# Patient Record
Sex: Male | Born: 1970 | Race: Black or African American | Hispanic: No | Marital: Single | Smoking: Never smoker
Health system: Southern US, Community
[De-identification: ages and names within clinical notes are randomized; demographics above are authoritative.]

## PROBLEM LIST (undated history)

## (undated) DIAGNOSIS — M549 Dorsalgia, unspecified: Secondary | ICD-10-CM

## (undated) DIAGNOSIS — G8929 Other chronic pain: Secondary | ICD-10-CM

## (undated) DIAGNOSIS — M25562 Pain in left knee: Secondary | ICD-10-CM

## (undated) DIAGNOSIS — M25561 Pain in right knee: Secondary | ICD-10-CM

## (undated) HISTORY — DX: Pain in right knee: M25.561

## (undated) HISTORY — DX: Other chronic pain: G89.29

## (undated) HISTORY — DX: Pain in left knee: M25.562

## (undated) HISTORY — DX: Dorsalgia, unspecified: M54.9

## (undated) SURGERY — Surgical Case
Anesthesia: *Unknown

---

## 2011-04-26 ENCOUNTER — Emergency Department (HOSPITAL_COMMUNITY): Payer: Worker's Compensation

## 2011-04-26 ENCOUNTER — Emergency Department (HOSPITAL_COMMUNITY)
Admission: EM | Admit: 2011-04-26 | Discharge: 2011-04-26 | Disposition: A | Discharge status: Home / Self Care | Payer: Worker's Compensation | Attending: Emergency Medicine | Admitting: Emergency Medicine

## 2011-04-26 DIAGNOSIS — M5137 Other intervertebral disc degeneration, lumbosacral region: Secondary | ICD-10-CM | POA: Insufficient documentation

## 2011-04-26 DIAGNOSIS — M79609 Pain in unspecified limb: Secondary | ICD-10-CM | POA: Insufficient documentation

## 2011-04-26 DIAGNOSIS — M5380 Other specified dorsopathies, site unspecified: Secondary | ICD-10-CM | POA: Insufficient documentation

## 2011-04-26 DIAGNOSIS — IMO0002 Reserved for concepts with insufficient information to code with codable children: Secondary | ICD-10-CM | POA: Insufficient documentation

## 2011-04-26 DIAGNOSIS — M1710 Unilateral primary osteoarthritis, unspecified knee: Secondary | ICD-10-CM

## 2011-04-26 DIAGNOSIS — M545 Low back pain, unspecified: Secondary | ICD-10-CM | POA: Insufficient documentation

## 2011-04-26 DIAGNOSIS — M51379 Other intervertebral disc degeneration, lumbosacral region without mention of lumbar back pain or lower extremity pain: Secondary | ICD-10-CM | POA: Insufficient documentation

## 2011-04-26 DIAGNOSIS — M25569 Pain in unspecified knee: Secondary | ICD-10-CM | POA: Insufficient documentation

## 2011-04-26 DIAGNOSIS — M5431 Sciatica, right side: Secondary | ICD-10-CM

## 2011-04-26 DIAGNOSIS — G8929 Other chronic pain: Secondary | ICD-10-CM | POA: Insufficient documentation

## 2011-04-26 DIAGNOSIS — M25469 Effusion, unspecified knee: Secondary | ICD-10-CM | POA: Insufficient documentation

## 2011-04-26 DIAGNOSIS — M5430 Sciatica, unspecified side: Secondary | ICD-10-CM | POA: Insufficient documentation

## 2011-04-26 MED ORDER — DEXAMETHASONE SODIUM PHOSPHATE 10 MG/ML IJ SOLN
10.0000 mg | Freq: Once | INTRAMUSCULAR | Status: AC
  Administered 2011-04-26: 10 mg via INTRAMUSCULAR
  Filled 2011-04-26: qty 1

## 2011-04-26 MED ORDER — KETOROLAC TROMETHAMINE 60 MG/2ML IM SOLN
60.0000 mg | Freq: Once | INTRAMUSCULAR | Status: AC
  Administered 2011-04-26: 60 mg via INTRAMUSCULAR
  Filled 2011-04-26: qty 2

## 2011-04-26 MED ORDER — PREDNISONE (PAK) 10 MG PO TABS: ORAL_TABLET | ORAL | Status: AC

## 2011-04-26 MED ORDER — TRAMADOL HCL 50 MG PO TABS: 50.0000 mg | ORAL_TABLET | Freq: Four times a day (QID) | ORAL | Status: AC | PRN

## 2011-04-26 MED ORDER — METHOCARBAMOL 500 MG PO TABS: 500.0000 mg | ORAL_TABLET | Freq: Two times a day (BID) | ORAL | Status: AC

## 2011-04-26 MED ORDER — DICLOFENAC SODIUM 75 MG PO TBEC: 75.0000 mg | DELAYED_RELEASE_TABLET | Freq: Two times a day (BID) | ORAL | Status: AC

## 2011-04-26 NOTE — ED Provider Notes (Signed)
Medical screening examination/treatment/procedure(s) were performed by non-physician practitioner and as supervising physician I was immediately available for consultation/collaboration.  Catheline Hixon R. Gaelan Glennon, MD 04/26/11 1549 

## 2011-04-26 NOTE — ED Notes (Signed)
Patient transported to X-ray 

## 2011-04-26 NOTE — ED Notes (Signed)
Pt. Was injured in October at work and continues to have lower back pain and rt. Knee pain

## 2011-04-26 NOTE — ED Provider Notes (Signed)
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 Reports since October this year has began to have lower back pain and right knee pain. Reports pain is worse on the right side. Describes pain as beginning just right of his spine and radiates around posterior buttock down her right lateral thigh to right knee. States pain is worse with certain positions such as bending and standing. Reports has been unable to sleep for the last 3 nights due to severe pain. Reports occasionally his knee will hurt independently of his back. States he will have significant swelling and standing for long periods of time in his right knee. Reports occasionally feels as if his right knee will give out on him. Patient is a 40 y.o. male presenting with back pain and knee pain. The history is provided by the patient.  Back Pain  This is a chronic problem. Episode onset: 2 months ago. The problem occurs constantly. The problem has been gradually worsening. The pain is associated with lifting heavy objects. The pain is present in the lumbar spine. The quality of the pain is described as aching and shooting. The pain radiates to the right thigh and right knee. The pain is severe. The symptoms are aggravated by bending and certain positions. Associated symptoms include leg pain. Pertinent negatives include no chest pain, no fever, no numbness, no headaches, no abdominal pain, no bowel incontinence, no perianal numbness, no bladder incontinence, no dysuria, no pelvic pain, no paresthesias, no paresis, no tingling and no weakness. He has tried bed rest and NSAIDs for the symptoms. The treatment provided no relief.  Knee Pain This is a chronic problem. Episode onset: 2 months ago. The problem occurs constantly. The problem has  been gradually worsening. Associated symptoms include joint swelling. Pertinent negatives include no abdominal pain, chest pain, chills, coughing, fever, headaches, myalgias, nausea, neck pain, numbness, urinary symptoms, vomiting or weakness. The symptoms are aggravated by bending, walking and standing. He has tried rest and NSAIDs for the symptoms. The treatment provided no relief.    History reviewed. No pertinent past medical history.  History reviewed. No pertinent past surgical history.  History reviewed. No pertinent family history.  History  Substance Use Topics  . Smoking status: Never Smoker   . Smokeless tobacco: Never Used  . Alcohol Use: No      Review of Systems  Constitutional: Negative for fever and chills.  HENT: Negative for neck pain.   Respiratory: Negative for cough and shortness of breath.   Cardiovascular: Negative for chest pain and palpitations.  Gastrointestinal: Negative for nausea, vomiting, abdominal pain and bowel incontinence.  Genitourinary: Negative for bladder incontinence, dysuria, hematuria, flank pain and pelvic pain.  Musculoskeletal: Positive for back pain and joint swelling. Negative for myalgias and gait problem.       Positive for knee pain  Neurological: Negative for dizziness, tingling, weakness, numbness, headaches and paresthesias.  All other systems reviewed and are negative.    Allergies  Review of patient's allergies indicates no known allergies.  Home Medications   Current Outpatient Rx  Name Route Sig Dispense Refill  . IBUPROFEN 200 MG PO TABS Oral Take 800 mg by mouth daily as needed. For pain       BP 140/83  Pulse 100  Temp(Src) 97.1 F (36.2 C) (Oral)  SpO2 100%  Physical Exam  Constitutional: He is oriented to person, place, and time. He appears well-developed and well-nourished.  HENT:  Head: Normocephalic and atraumatic.  Eyes: Pupils are equal, round, and reactive to light.  Musculoskeletal:       Right  knee: He exhibits swelling and effusion. He exhibits normal range of motion, no deformity, no laceration, no erythema, normal alignment, no LCL laxity, normal patellar mobility, no bony tenderness and no MCL laxity. tenderness found. Medial joint line and lateral joint line tenderness noted. No patellar tendon tenderness noted.       Lumbar back: He exhibits tenderness, pain and spasm. He exhibits normal range of motion, no bony tenderness, no swelling, no edema, no deformity and no laceration.       Back:       Patient has a positive straight-leg raise on the right side. Normal strength, pulses, sensation distally. Reproduction of pain with percussion over right lumbar paraspinal region.   Neurological: He is alert and oriented to person, place, and time.  Skin: Skin is warm and dry. No rash noted. No erythema. No pallor.  Psychiatric: He has a normal mood and affect. His behavior is normal.    ED Course  Procedures   No results found for this or any previous visit. Dg Lumbar Spine Complete  04/26/2011  *RADIOLOGY REPORT*  Clinical Data: Lower back pain, injured lifting  LUMBAR SPINE - COMPLETE 4+ VIEW  Comparison: None  Findings: Hypoplastic last pair of ribs. Four non-rib bearing lumbar type vertebrae. Minimal scattered disc space narrowing and endplate spur formation. Vertebral body and disc space heights otherwise maintained. Osseous mineralization normal. No acute fracture, subluxation or bone destruction. No spondylolysis. SI joints symmetric.  IMPRESSION: Transitional anatomy. Scattered mild degenerative disc disease changes. No acute abnormalities.  Original Report Authenticated By: Lollie Marrow, M.D.   Dg Knee Complete 4 Views Right  04/26/2011  *RADIOLOGY REPORT*  Clinical Data: Anterior knee pain, injured lifting  RIGHT KNEE - COMPLETE 4+ VIEW  Comparison: None  Findings: Scattered joint space narrowing and marginal spur formation. Bones diffusely demineralized. Question minimal  anterior infrapatellar soft tissue swelling. No acute fracture, dislocation or bone destruction. Irregularity of articular surfaces of both femoral condyles. Minimal knee joint effusion.  IMPRESSION: Bony demineralization. Osteoarthritic changes right knee with minimal knee joint effusion. No definite acute bony abnormalities.  Original Report Authenticated By: Lollie Marrow, M.D.    MDM   10:22 AM X-rays indicate osteoarthritis and degenerative disease. Likely patient has a component of sciatica as well. Will treat with anti-inflammatory medication steroids and muscle relaxers. We'll also provide referrals for [facility] for persistent pain in further evaluation. Reports Toradol and Decadron has helped his pain.       Thomasene Lot, [place] 04/26/11 1038

## 2015-04-01 ENCOUNTER — Ambulatory Visit (INDEPENDENT_AMBULATORY_CARE_PROVIDER_SITE_OTHER): Payer: Managed Care, Other (non HMO) | Admitting: Emergency Medicine

## 2015-04-01 VITALS — BP 118/80 | HR 80 | Temp 98.4°F | Resp 16 | Ht 76.0 in | Wt 246.0 lb

## 2015-04-01 DIAGNOSIS — S335XXA Sprain of ligaments of lumbar spine, initial encounter: Secondary | ICD-10-CM | POA: Diagnosis not present

## 2015-04-01 MED ORDER — HYDROCODONE-ACETAMINOPHEN 5-325 MG PO TABS: 1.0000 | ORAL_TABLET | ORAL | Status: DC | PRN

## 2015-04-01 MED ORDER — CYCLOBENZAPRINE HCL 10 MG PO TABS: 10.0000 mg | ORAL_TABLET | Freq: Three times a day (TID) | ORAL | Status: DC | PRN

## 2015-04-01 MED ORDER — NAPROXEN SODIUM 550 MG PO TABS: 550.0000 mg | ORAL_TABLET | Freq: Two times a day (BID) | ORAL | Status: DC

## 2015-04-01 NOTE — Patient Instructions (Signed)

## 2015-04-01 NOTE — Progress Notes (Signed)
Subjective:  Patient ID: Mathew Burns, male    DOB: 02-16-1971  Age: 44 y.o. MRN: [id number]  CC: Back Pain   HPI [name] presents  with acute back pain. 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 No radicular symptoms no numbness tingling or weakness or radiation of pain. He's had no direct injury. He says the pain is worse when he bends over or sits. He has no history of prior back injury.  History Hazem has no past medical history on file.   He has no past surgical history on file.   His  family history is not on file.  He   reports that he has never smoked. He has never used smokeless tobacco. He reports that he does not drink alcohol. His drug history is not on file.  Outpatient Prescriptions Prior to Visit  Medication Sig Dispense Refill  . ibuprofen (ADVIL,MOTRIN) 200 MG tablet Take 800 mg by mouth daily as needed. For pain      No facility-administered medications prior to visit.    Social History   Social History  . Marital Status: Single    Spouse Name: N/A  . Number of Children: N/A  . Years of Education: N/A   Social History Main Topics  . Smoking status: Never Smoker   . Smokeless tobacco: Never Used  . Alcohol Use: No  . Drug Use: None  . Sexual Activity: Not Asked   Other Topics Concern  . None   Social History Narrative     Review of Systems  Constitutional: Negative for fever, chills and appetite change.  HENT: Negative for congestion, ear pain, postnasal drip, sinus pressure and sore throat.   Eyes: Negative for pain and redness.  Respiratory: Negative for cough, shortness of breath and wheezing.   Cardiovascular: Negative for leg swelling.  Gastrointestinal: Negative for nausea, vomiting, abdominal pain, diarrhea, constipation and blood in stool.  Endocrine: Negative for polyuria.  Genitourinary: Negative for dysuria, urgency, frequency and flank pain.  Musculoskeletal:  Positive for back pain. Negative for gait problem.  Skin: Negative for rash.  Neurological: Negative for weakness and headaches.  Psychiatric/Behavioral: Negative for confusion and decreased concentration. The patient is not nervous/anxious.     Objective:  BP 118/80 mmHg  Pulse 80  Temp(Src) 98.4 F (36.9 C) (Oral)  Resp 16  Ht  (1.93 m)  Wt 246 lb (111.585 kg)  BMI 29.96 kg/m2  SpO2 96%  Physical Exam  Constitutional: He is oriented to person, place, and time. He appears well-developed and well-nourished. No distress.  HENT:  Head: Normocephalic and atraumatic.  Right Ear: External ear normal.  Left Ear: External ear normal.  Nose: Nose normal.  Eyes: Conjunctivae and EOM are normal. Pupils are equal, round, and reactive to light. No scleral icterus.  Neck: Normal range of motion. Neck supple. No tracheal deviation present.  Cardiovascular: Normal rate, regular rhythm and normal heart sounds.   Pulmonary/Chest: Effort normal. No respiratory distress. He has no wheezes. He has no rales.  Abdominal: He exhibits no mass. There is no tenderness. There is no rebound and no guarding.  Musculoskeletal: He exhibits no edema.       Lumbar back: He exhibits tenderness and spasm.  Lymphadenopathy:    He has no cervical adenopathy.  Neurological: He is alert and oriented to person, place, and time. Coordination normal.  Skin: Skin is warm and dry. No  rash noted.  Psychiatric: He has a normal mood and affect. His behavior is normal.      Assessment & Plan:   Gregary SignsSean was seen today for back pain.  Diagnoses and all orders for this visit:  Sprain of lumbar region, initial encounter  Other orders -     naproxen sodium (ANAPROX DS) 550 MG tablet; Take 1 tablet (550 mg total) by mouth 2 (two) times daily with a meal. -     cyclobenzaprine (FLEXERIL) 10 MG tablet; Take 1 tablet (10 mg total) by mouth 3 (three) times daily as needed for muscle spasms. -      HYDROcodone-acetaminophen (NORCO) 5-325 MG tablet; Take 1-2 tablets by mouth every 4 (four) hours as needed.  I am having Mr. Conception OmsBlakemore start on naproxen sodium, cyclobenzaprine, and HYDROcodone-acetaminophen. I am also having him maintain his ibuprofen.  Meds ordered this encounter  Medications  . naproxen sodium (ANAPROX DS) 550 MG tablet    Sig: Take 1 tablet (550 mg total) by mouth 2 (two) times daily with a meal.    Dispense:  40 tablet    Refill:  0  . cyclobenzaprine (FLEXERIL) 10 MG tablet    Sig: Take 1 tablet (10 mg total) by mouth 3 (three) times daily as needed for muscle spasms.    Dispense:  30 tablet    Refill:  0  . HYDROcodone-acetaminophen (NORCO) 5-325 MG tablet    Sig: Take 1-2 tablets by mouth every 4 (four) hours as needed.    Dispense:  30 tablet    Refill:  0    Appropriate red flag conditions were discussed with the patient as well as actions that should be taken.  Patient expressed his understanding.  Follow-up: Return if symptoms worsen or fail to improve.  Carmelina DaneAnderson, Jeffery S, MD

## 2015-04-14 ENCOUNTER — Ambulatory Visit (INDEPENDENT_AMBULATORY_CARE_PROVIDER_SITE_OTHER): Payer: Managed Care, Other (non HMO)

## 2015-04-14 ENCOUNTER — Ambulatory Visit (INDEPENDENT_AMBULATORY_CARE_PROVIDER_SITE_OTHER): Payer: Managed Care, Other (non HMO) | Admitting: Family Medicine

## 2015-04-14 VITALS — BP 112/64 | HR 96 | Temp 98.3°F | Resp 18 | Ht 75.0 in | Wt 244.0 lb

## 2015-04-14 DIAGNOSIS — M545 Low back pain, unspecified: Secondary | ICD-10-CM

## 2015-04-14 MED ORDER — NAPROXEN SODIUM 550 MG PO TABS: 550.0000 mg | ORAL_TABLET | Freq: Two times a day (BID) | ORAL | Status: DC

## 2015-04-14 MED ORDER — HYDROCODONE-ACETAMINOPHEN 5-325 MG PO TABS: 1.0000 | ORAL_TABLET | Freq: Four times a day (QID) | ORAL | Status: DC | PRN

## 2015-04-14 MED ORDER — METHOCARBAMOL 500 MG PO TABS: 500.0000 mg | ORAL_TABLET | Freq: Four times a day (QID) | ORAL | Status: DC

## 2015-04-14 NOTE — Patient Instructions (Signed)
Low Back Sprain With Rehab A sprain is an injury in which a ligament is torn. The ligaments of the lower back are vulnerable to sprains. However, they are strong and require great force to be injured. These ligaments are important for stabilizing the spinal column. Sprains are classified into three categories. Grade 1 sprains cause pain, but the tendon is not lengthened. Grade 2 sprains include a lengthened ligament, due to the ligament being stretched or partially ruptured. With grade 2 sprains there is still function, although the function may be decreased. Grade 3 sprains involve a complete tear of the tendon or muscle, and function is usually impaired. SYMPTOMS   Severe pain in the lower back.  Sometimes, a feeling of a "pop," "snap," or tear, at the time of injury.  Tenderness and sometimes swelling at the injury site.  Uncommonly, bruising (contusion) within 48 hours of injury.  Muscle spasms in the back. CAUSES  Low back sprains occur when a force is placed on the ligaments that is greater than they can handle. Common causes of injury include:  Performing a stressful act while off-balance.  Repetitive stressful activities that involve movement of the lower back.  Direct hit (trauma) to the lower back. RISK INCREASES WITH:  Contact sports (football, wrestling).  Collisions (major skiing accidents).  Sports that require throwing or lifting (baseball, weightlifting).  Sports involving twisting of the spine (gymnastics, diving, tennis, golf).  Poor strength and flexibility.  Inadequate protection.  Previous back injury or surgery (especially fusion). PREVENTION  Wear properly fitted and padded protective equipment.  Warm up and stretch properly before activity.  Allow for adequate recovery between workouts.  Maintain physical fitness:  Strength, flexibility, and endurance.  Cardiovascular fitness.  Maintain a healthy body weight. PROGNOSIS  If treated properly,  low back sprains usually heal with non-surgical treatment. The length of time for healing depends on the severity of the injury.  RELATED COMPLICATIONS   Recurring symptoms, resulting in a chronic problem.  Chronic inflammation and pain in the low back.  Delayed healing or resolution of symptoms, especially if activity is resumed too soon.  Prolonged impairment.  Unstable or arthritic joints of the low back. TREATMENT  Treatment first involves the use of ice and medicine, to reduce pain and inflammation. The use of strengthening and stretching exercises may help reduce pain with activity. These exercises may be performed at home or with a therapist. Severe injuries may require referral to a therapist for further evaluation and treatment, such as ultrasound. Your caregiver may advise that you wear a back brace or corset, to help reduce pain and discomfort. Often, prolonged bed rest results in greater harm then benefit. Corticosteroid injections may be recommended. However, these should be reserved for the most serious cases. It is important to avoid using your back when lifting objects. At night, sleep on your back on a firm mattress, with a pillow placed under your knees. If non-surgical treatment is unsuccessful, surgery may be needed.  MEDICATION   If pain medicine is needed, nonsteroidal anti-inflammatory medicines (aspirin and ibuprofen), or other minor pain relievers (acetaminophen), are often advised.  Do not take pain medicine for 7 days before surgery.  Prescription pain relievers may be given, if your caregiver thinks they are needed. Use only as directed and only as much as you need.  Ointments applied to the skin may be helpful.  Corticosteroid injections may be given by your caregiver. These injections should be reserved for the most serious cases, because   they may only be given a certain number of times. HEAT AND COLD  Cold treatment (icing) should be applied for 10 to 15  minutes every 2 to 3 hours for inflammation and pain, and immediately after activity that aggravates your symptoms. Use ice packs or an ice massage.  Heat treatment may be used before performing stretching and strengthening activities prescribed by your caregiver, physical therapist, or athletic trainer. Use a heat pack or a warm water soak. SEEK MEDICAL CARE IF:   Symptoms get worse or do not improve in 2 to 4 weeks, despite treatment.  You develop numbness or weakness in either leg.  You lose bowel or bladder function.  Any of the following occur after surgery: fever, increased pain, swelling, redness, drainage of fluids, or bleeding in the affected area.  New, unexplained symptoms develop. (Drugs used in treatment may produce side effects.) EXERCISES  RANGE OF MOTION (ROM) AND STRETCHING EXERCISES - Low Back Sprain Most people with lower back pain will find that their symptoms get worse with excessive bending forward (flexion) or arching at the lower back (extension). The exercises that will help resolve your symptoms will focus on the opposite motion.  Your physician, physical therapist or athletic trainer will help you determine which exercises will be most helpful to resolve your lower back pain. Do not complete any exercises without first consulting with your caregiver. Discontinue any exercises which make your symptoms worse, until you speak to your caregiver. If you have pain, numbness or tingling which travels down into your buttocks, leg or foot, the goal of the therapy is for these symptoms to move closer to your back and eventually resolve. Sometimes, these leg symptoms will get better, but your lower back pain may worsen. This is often an indication of progress in your rehabilitation. Be very alert to any changes in your symptoms and the activities in which you participated in the 24 hours prior to the change. Sharing this information with your caregiver will allow him or her to most  efficiently treat your condition. These exercises may help you when beginning to rehabilitate your injury. Your symptoms may resolve with or without further involvement from your physician, physical therapist or athletic trainer. While completing these exercises, remember:   Restoring tissue flexibility helps normal motion to return to the joints. This allows healthier, less painful movement and activity.  An effective stretch should be held for at least 30 seconds.  A stretch should never be painful. You should only feel a gentle lengthening or release in the stretched tissue. FLEXION RANGE OF MOTION AND STRETCHING EXERCISES: STRETCH - Flexion, Single Knee to Chest   Lie on a firm bed or floor with both legs extended in front of you.  Keeping one leg in contact with the floor, bring your opposite knee to your chest. Hold your leg in place by either grabbing behind your thigh or at your knee.  Pull until you feel a gentle stretch in your low back. Hold __________ seconds.  Slowly release your grasp and repeat the exercise with the opposite side. Repeat __________ times. Complete this exercise __________ times per day.  STRETCH - Flexion, Double Knee to Chest  Lie on a firm bed or floor with both legs extended in front of you.  Keeping one leg in contact with the floor, bring your opposite knee to your chest.  Tense your stomach muscles to support your back and then lift your other knee to your chest. Hold your legs in   place by either grabbing behind your thighs or at your knees.  Pull both knees toward your chest until you feel a gentle stretch in your low back. Hold __________ seconds.  Tense your stomach muscles and slowly return one leg at a time to the floor. Repeat __________ times. Complete this exercise __________ times per day.  STRETCH - Low Trunk Rotation  Lie on a firm bed or floor. Keeping your legs in front of you, bend your knees so they are both pointed toward the  ceiling and your feet are flat on the floor.  Extend your arms out to the side. This will stabilize your upper body by keeping your shoulders in contact with the floor.  Gently and slowly drop both knees together to one side until you feel a gentle stretch in your low back. Hold for __________ seconds.  Tense your stomach muscles to support your lower back as you bring your knees back to the starting position. Repeat the exercise to the other side. Repeat __________ times. Complete this exercise __________ times per day  EXTENSION RANGE OF MOTION AND FLEXIBILITY EXERCISES: STRETCH - Extension, Prone on Elbows   Lie on your stomach on the floor, a bed will be too soft. Place your palms about shoulder width apart and at the height of your head.  Place your elbows under your shoulders. If this is too painful, stack pillows under your chest.  Allow your body to relax so that your hips drop lower and make contact more completely with the floor.  Hold this position for __________ seconds.  Slowly return to lying flat on the floor. Repeat __________ times. Complete this exercise __________ times per day.  RANGE OF MOTION - Extension, Prone Press Ups  Lie on your stomach on the floor, a bed will be too soft. Place your palms about shoulder width apart and at the height of your head.  Keeping your back as relaxed as possible, slowly straighten your elbows while keeping your hips on the floor. You may adjust the placement of your hands to maximize your comfort. As you gain motion, your hands will come more underneath your shoulders.  Hold this position __________ seconds.  Slowly return to lying flat on the floor. Repeat __________ times. Complete this exercise __________ times per day.  RANGE OF MOTION- Quadruped, Neutral Spine   Assume a hands and knees position on a firm surface. Keep your hands under your shoulders and your knees under your hips. You may place padding under your knees for  comfort.  Drop your head and point your tailbone toward the ground below you. This will round out your lower back like an angry cat. Hold this position for __________ seconds.  Slowly lift your head and release your tail bone so that your back sags into a large arch, like an old horse.  Hold this position for __________ seconds.  Repeat this until you feel limber in your low back.  Now, find your "sweet spot." This will be the most comfortable position somewhere between the two previous positions. This is your neutral spine. Once you have found this position, tense your stomach muscles to support your low back.  Hold this position for __________ seconds. Repeat __________ times. Complete this exercise __________ times per day.  STRENGTHENING EXERCISES - Low Back Sprain These exercises may help you when beginning to rehabilitate your injury. These exercises should be done near your "sweet spot." This is the neutral, low-back arch, somewhere between fully rounded and   fully arched, that is your least painful position. When performed in this safe range of motion, these exercises can be used for people who have either a flexion or extension based injury. These exercises may resolve your symptoms with or without further involvement from your physician, physical therapist or athletic trainer. While completing these exercises, remember:   Muscles can gain both the endurance and the strength needed for everyday activities through controlled exercises.  Complete these exercises as instructed by your physician, physical therapist or athletic trainer. Increase the resistance and repetitions only as guided.  You may experience muscle soreness or fatigue, but the pain or discomfort you are trying to eliminate should never worsen during these exercises. If this pain does worsen, stop and make certain you are following the directions exactly. If the pain is still present after adjustments, discontinue the  exercise until you can discuss the trouble with your caregiver. STRENGTHENING - Deep Abdominals, Pelvic Tilt   Lie on a firm bed or floor. Keeping your legs in front of you, bend your knees so they are both pointed toward the ceiling and your feet are flat on the floor.  Tense your lower abdominal muscles to press your low back into the floor. This motion will rotate your pelvis so that your tail bone is scooping upwards rather than pointing at your feet or into the floor. With a gentle tension and even breathing, hold this position for __________ seconds. Repeat __________ times. Complete this exercise __________ times per day.  STRENGTHENING - Abdominals, Crunches   Lie on a firm bed or floor. Keeping your legs in front of you, bend your knees so they are both pointed toward the ceiling and your feet are flat on the floor. Cross your arms over your chest.  Slightly tip your chin down without bending your neck.  Tense your abdominals and slowly lift your trunk high enough to just clear your shoulder blades. Lifting higher can put excessive stress on the lower back and does not further strengthen your abdominal muscles.  Control your return to the starting position. Repeat __________ times. Complete this exercise __________ times per day.  STRENGTHENING - Quadruped, Opposite UE/LE Lift   Assume a hands and knees position on a firm surface. Keep your hands under your shoulders and your knees under your hips. You may place padding under your knees for comfort.  Find your neutral spine and gently tense your abdominal muscles so that you can maintain this position. Your shoulders and hips should form a rectangle that is parallel with the floor and is not twisted.  Keeping your trunk steady, lift your right hand no higher than your shoulder and then your left leg no higher than your hip. Make sure you are not holding your breath. Hold this position for __________ seconds.  Continuing to keep  your abdominal muscles tense and your back steady, slowly return to your starting position. Repeat with the opposite arm and leg. Repeat __________ times. Complete this exercise __________ times per day.  STRENGTHENING - Abdominals and Quadriceps, Straight Leg Raise   Lie on a firm bed or floor with both legs extended in front of you.  Keeping one leg in contact with the floor, bend the other knee so that your foot can rest flat on the floor.  Find your neutral spine, and tense your abdominal muscles to maintain your spinal position throughout the exercise.  Slowly lift your straight leg off the floor about 6 inches for a count of   15, making sure to not hold your breath.  Still keeping your neutral spine, slowly lower your leg all the way to the floor. Repeat this exercise with each leg __________ times. Complete this exercise __________ times per day. POSTURE AND BODY MECHANICS CONSIDERATIONS - Low Back Sprain Keeping correct posture when sitting, standing or completing your activities will reduce the stress put on different body tissues, allowing injured tissues a chance to heal and limiting painful experiences. The following are general guidelines for improved posture. Your physician or physical therapist will provide you with any instructions specific to your needs. While reading these guidelines, remember:  The exercises prescribed by your provider will help you have the flexibility and strength to maintain correct postures.  The correct posture provides the best environment for your joints to work. All of your joints have less wear and tear when properly supported by a spine with good posture. This means you will experience a healthier, less painful body.  Correct posture must be practiced with all of your activities, especially prolonged sitting and standing. Correct posture is as important when doing repetitive low-stress activities (typing) as it is when doing a single heavy-load  activity (lifting). RESTING POSITIONS Consider which positions are most painful for you when choosing a resting position. If you have pain with flexion-based activities (sitting, bending, stooping, squatting), choose a position that allows you to rest in a less flexed posture. You would want to avoid curling into a fetal position on your side. If your pain worsens with extension-based activities (prolonged standing, working overhead), avoid resting in an extended position such as sleeping on your stomach. Most people will find more comfort when they rest with their spine in a more neutral position, neither too rounded nor too arched. Lying on a non-sagging bed on your side with a pillow between your knees, or on your back with a pillow under your knees will often provide some relief. Keep in mind, being in any one position for a prolonged period of time, no matter how correct your posture, can still lead to stiffness. PROPER SITTING POSTURE In order to minimize stress and discomfort on your spine, you must sit with correct posture. Sitting with good posture should be effortless for a healthy body. Returning to good posture is a gradual process. Many people can work toward this most comfortably by using various supports until they have the flexibility and strength to maintain this posture on their own. When sitting with proper posture, your ears will fall over your shoulders and your shoulders will fall over your hips. You should use the back of the chair to support your upper back. Your lower back will be in a neutral position, just slightly arched. You may place a small pillow or folded towel at the base of your lower back for  support.  When working at a desk, create an environment that supports good, upright posture. Without extra support, muscles tire, which leads to excessive strain on joints and other tissues. Keep these recommendations in mind: CHAIR:  A chair should be able to slide under your desk  when your back makes contact with the back of the chair. This allows you to work closely.  The chair's height should allow your eyes to be level with the upper part of your monitor and your hands to be slightly lower than your elbows. BODY POSITION  Your feet should make contact with the floor. If this is not possible, use a foot rest.  Keep your ears   over your shoulders. This will reduce stress on your neck and low back. INCORRECT SITTING POSTURES  If you are feeling tired and unable to assume a healthy sitting posture, do not slouch or slump. This puts excessive strain on your back tissues, causing more damage and pain. Healthier options include:  Using more support, like a lumbar pillow.  Switching tasks to something that requires you to be upright or walking.  Talking a brief walk.  Lying down to rest in a neutral-spine position. PROLONGED STANDING WHILE SLIGHTLY LEANING FORWARD  When completing a task that requires you to lean forward while standing in one place for a long time, place either foot up on a stationary 2-4 inch high object to help maintain the best posture. When both feet are on the ground, the lower back tends to lose its slight inward curve. If this curve flattens (or becomes too large), then the back and your other joints will experience too much stress, tire more quickly, and can cause pain. CORRECT STANDING POSTURES Proper standing posture should be assumed with all daily activities, even if they only take a few moments, like when brushing your teeth. As in sitting, your ears should fall over your shoulders and your shoulders should fall over your hips. You should keep a slight tension in your abdominal muscles to brace your spine. Your tailbone should point down to the ground, not behind your body, resulting in an over-extended swayback posture.  INCORRECT STANDING POSTURES  Common incorrect standing postures include a forward head, locked knees and/or an excessive  swayback. WALKING Walk with an upright posture. Your ears, shoulders and hips should all line-up. PROLONGED ACTIVITY IN A FLEXED POSITION When completing a task that requires you to bend forward at your waist or lean over a low surface, try to find a way to stabilize 3 out of 4 of your limbs. You can place a hand or elbow on your thigh or rest a knee on the surface you are reaching across. This will provide you more stability, so that your muscles do not tire as quickly. By keeping your knees relaxed, or slightly bent, you will also reduce stress across your lower back. CORRECT LIFTING TECHNIQUES DO :  Assume a wide stance. This will provide you more stability and the opportunity to get as close as possible to the object which you are lifting.  Tense your abdominals to brace your spine. Bend at the knees and hips. Keeping your back locked in a neutral-spine position, lift using your leg muscles. Lift with your legs, keeping your back straight.  Test the weight of unknown objects before attempting to lift them.  Try to keep your elbows locked down at your sides in order get the best strength from your shoulders when carrying an object.  Always ask for help when lifting heavy or awkward objects. INCORRECT LIFTING TECHNIQUES DO NOT:   Lock your knees when lifting, even if it is a small object.  Bend and twist. Pivot at your feet or move your feet when needing to change directions.  Assume that you can safely pick up even a paperclip without proper posture.   This information is not intended to replace advice given to you by your health care provider. Make sure you discuss any questions you have with your health care provider.   Document Released: 04/30/2005 Document Revised: 05/21/2014 Document Reviewed: 08/12/2008 [employer] Patient Education 2016 [employer]  

## 2015-04-14 NOTE — Progress Notes (Signed)
Subjective:    Patient ID: Mathew Burns, male    DOB: Sep 26, 1970, 44 y.o.   MRN: [id number]  04/14/2015  Follow-up   HPI This 44 y.o. male presents for two week follow-up of lower back pain/strain.  Evaluated by Dr. Dareen Piano on 04/01/15; prescribed Flexeril , hydrocodone 5/325, and Naproxen .  No imaging warranted at that visit.  Rested after last visit with improvement.  Returned to work after the weekend with worsening and recurrent pain.  Now having a constant pressure pain in lower back.  Scared of pain.  Had an episode where went down due to pain yesterday.  Lower back on R.  More constant than previously. Intermittent radiation when falls down only.  No n/t/w.  No saddle paresthesias.  No b/b dysfunction. Bending over a lot and feels like an old man.  No urinary symptoms.  Taking hydrocodone with food; takes Naproxen at lunch; takes Naproxen and hydrocodone at supper; then takes Flexeril at bedtime.  Pain worsens at nighttime.  Only taking 1/2 Flexeril because really strong.  Feels refreshed in morning which is an improvemnet; cannot take Flexeril during the day.  Heating pad which helps in evenings.  Elevating leg with improvement.  Tries to relax more; a very busy person and trying to relax.   Review of Systems  Constitutional: Negative for fever, chills, diaphoresis and fatigue.  Gastrointestinal: Negative for nausea, vomiting, abdominal pain, diarrhea, constipation, blood in stool, abdominal distention, anal bleeding and rectal pain.  Musculoskeletal: Positive for myalgias and back pain. Negative for joint swelling.  Skin: Negative for rash.  Neurological: Negative for weakness and numbness.    History reviewed. No pertinent past medical history. History reviewed. No pertinent past surgical history. No Known Allergies  Social History   Social History  . Marital Status: Single    Spouse Name: N/A  . Number of Children: N/A  . Years of Education: N/A   Occupational  History  . Not on file.   Social History Main Topics  . Smoking status: Never Smoker   . Smokeless tobacco: Never Used  . Alcohol Use: No  . Drug Use: Not on file  . Sexual Activity: Not on file   Other Topics Concern  . Not on file   Social History Narrative   History reviewed. No pertinent family history.     Objective:    BP 112/64 mmHg  Pulse 96  Temp(Src) 98.3 F (36.8 C)  Resp 18  Ht  (1.905 m)  Wt 244 lb (110.678 kg)  BMI 30.50 kg/m2  SpO2 98% Physical Exam  Constitutional: He is oriented to person, place, and time. He appears well-developed and well-nourished. No distress.  HENT:  Head: Normocephalic and atraumatic.  Eyes: Conjunctivae and EOM are normal. Pupils are equal, round, and reactive to light.  Neck: Normal range of motion. Neck supple. Carotid bruit is not present. No thyromegaly present.  Cardiovascular: Normal rate, regular rhythm, normal heart sounds and intact distal pulses.  Exam reveals no gallop and no friction rub.   No murmur heard. Pulmonary/Chest: Effort normal and breath sounds normal. He has no wheezes. He has no rales.  Musculoskeletal:       Lumbar back: He exhibits tenderness, pain and spasm. He exhibits normal range of motion, no bony tenderness, no swelling, no edema and normal pulse.  Lumbar spine:  Non-tender midline; + paraspinal regions B.  Straight leg raises negative B; toe and heel walking intact; marching intact; motor 5/5 BLE.  Full ROM lumbar spine without limitation. Pain with flexion.   Lymphadenopathy:    He has no cervical adenopathy.  Neurological: He is alert and oriented to person, place, and time. No cranial nerve deficit.  Skin: Skin is warm and dry. No rash noted. He is not diaphoretic.  Psychiatric: He has a normal mood and affect. His behavior is normal.  Nursing note and vitals reviewed.  No results found for this or any previous visit.   [facility] reading (PRIMARY) by  Dr. Katrinka BlazingSmith. LUMBAR SPINE FILMS:  DEGENERATIVE CHANGES AT L3-4; SPURRING. LARGE STOOL BURDEN.      Assessment & Plan:   1. Right-sided low back pain without sciatica    -Persistent due to overuse at work. -No radicular symptoms at this time. -Refill of Naproxen provided. -Rx for Robaxin provided for daytime use.  Continue Flexeril qhs. -Refill of Hydrocodone provided. -OOW note provided for tomorrow. -Home exercise program provided to perform daily. -Call if no improvement in two weeks for formal physical therapy. May warrant light duty at work for the next two weeks.   Orders Placed This Encounter  Procedures  . DG Lumbar Spine Complete    Standing Status: Future     Number of Occurrences: 1     Standing Expiration Date: 04/13/2016    Order Specific Question:  Reason for Exam (SYMPTOM  OR DIAGNOSIS REQUIRED)    Answer:  low back pain on R    Order Specific Question:  Preferred imaging location?    Answer:  External   Meds ordered this encounter  Medications  . methocarbamol (ROBAXIN) 500 MG tablet    Sig: Take 1 tablet (500 mg total) by mouth 4 (four) times daily.    Dispense:  40 tablet    Refill:  0  . naproxen sodium (ANAPROX DS) 550 MG tablet    Sig: Take 1 tablet (550 mg total) by mouth 2 (two) times daily with a meal.    Dispense:  40 tablet    Refill:  0  . HYDROcodone-acetaminophen (NORCO) 5-325 MG tablet    Sig: Take 1 tablet by mouth every 6 (six) hours as needed.    Dispense:  30 tablet    Refill:  0    No Follow-up on file.    Dillian Feig Paulita FujitaMartin Michiko Lineman, M.D. [facility]  [place] [address] [place], [place]  [id number] [phone] phone [phone] fax

## 2015-04-20 ENCOUNTER — Other Ambulatory Visit: Payer: Self-pay | Admitting: Emergency Medicine

## 2015-04-20 ENCOUNTER — Telehealth: Payer: Self-pay

## 2015-04-20 MED ORDER — TRAMADOL HCL 50 MG PO TABS: 50.0000 mg | ORAL_TABLET | Freq: Three times a day (TID) | ORAL | Status: DC | PRN

## 2015-04-20 NOTE — Telephone Encounter (Signed)
I printed a prescription for tramadol.

## 2015-04-20 NOTE — Telephone Encounter (Signed)
LMOM that rx is at the pharmacy and to call if he has any questions or concerns.

## 2015-04-20 NOTE — Telephone Encounter (Signed)
Pt is needing to talk with dr Dareen Pianoanderson about getting something not as strong for pain so that it does not make him sleepy  Best number [phone]

## 2015-05-05 ENCOUNTER — Telehealth: Payer: Self-pay

## 2015-05-05 NOTE — Telephone Encounter (Signed)
Pt's pain in his back is getting worse. He would like to know if he could take 2 traMADol (ULTRAM) 50 MG tablet [[id number] every 8 hours. CB # [phone]

## 2015-05-06 ENCOUNTER — Ambulatory Visit (INDEPENDENT_AMBULATORY_CARE_PROVIDER_SITE_OTHER): Payer: Managed Care, Other (non HMO) | Admitting: Emergency Medicine

## 2015-05-06 VITALS — BP 130/70 | HR 105 | Temp 98.3°F | Resp 16 | Ht 75.0 in | Wt 246.0 lb

## 2015-05-06 DIAGNOSIS — S335XXD Sprain of ligaments of lumbar spine, subsequent encounter: Secondary | ICD-10-CM | POA: Diagnosis not present

## 2015-05-06 MED ORDER — TRAMADOL HCL 50 MG PO TABS: 100.0000 mg | ORAL_TABLET | Freq: Four times a day (QID) | ORAL | Status: DC | PRN

## 2015-05-06 MED ORDER — NAPROXEN SODIUM 550 MG PO TABS: 550.0000 mg | ORAL_TABLET | Freq: Two times a day (BID) | ORAL | Status: DC

## 2015-05-06 NOTE — Progress Notes (Signed)
Subjective:  Patient ID: Mathew Burns, male    DOB: November 24, 1970  Age: 44 y.o. MRN: [id number]  CC: Follow-up and other   HPI Mathew Burns presents   With recurrent back pain he was treated previously with dictation for back pain. For follow-up. He has intermittent flares of his back pain he has no radicular symptoms. No numbness tingling or weakness. He has no recurrent recurrent injury. He's found that he gets good pain relief with tramadol 100 mg and is not sedating.  History Mathew Burns has no past medical history on file.   He has no past surgical history on file.   His  family history is not on file.  He   reports that he has never smoked. He has never used smokeless tobacco. He reports that he does not drink alcohol. His drug history is not on file.  Outpatient Prescriptions Prior to Visit  Medication Sig Dispense Refill  . cyclobenzaprine (FLEXERIL) 10 MG tablet Take 1 tablet (10 mg total) by mouth 3 (three) times daily as needed for muscle spasms. 30 tablet 0  . HYDROcodone-acetaminophen (NORCO) 5-325 MG tablet Take 1 tablet by mouth every 6 (six) hours as needed. 30 tablet 0  . naproxen sodium (ANAPROX DS) 550 MG tablet Take 1 tablet (550 mg total) by mouth 2 (two) times daily with a meal. 40 tablet 0  . traMADol (ULTRAM) 50 MG tablet Take 1 tablet (50 mg total) by mouth every 8 (eight) hours as needed. 40 tablet 0  . ibuprofen (ADVIL,MOTRIN) 200 MG tablet Take 800 mg by mouth daily as needed. Reported on 05/06/2015    . methocarbamol (ROBAXIN) 500 MG tablet Take 1 tablet (500 mg total) by mouth 4 (four) times daily. (Patient not taking: Reported on 05/06/2015) 40 tablet 0   No facility-administered medications prior to visit.    Social History   Social History  . Marital Status: Single    Spouse Name: N/A  . Number of Children: N/A  . Years of Education: N/A   Social History Main Topics  . Smoking status: Never Smoker   . Smokeless tobacco: Never Used  . Alcohol  Use: No  . Drug Use: None  . Sexual Activity: Not Asked   Other Topics Concern  . None   Social History Narrative     Review of Systems  Constitutional: Negative for fever, chills and appetite change.  HENT: Negative for congestion, ear pain, postnasal drip, sinus pressure and sore throat.   Eyes: Negative for pain and redness.  Respiratory: Negative for cough, shortness of breath and wheezing.   Cardiovascular: Negative for leg swelling.  Gastrointestinal: Negative for nausea, vomiting, abdominal pain, diarrhea, constipation and blood in stool.  Endocrine: Negative for polyuria.  Genitourinary: Negative for dysuria, urgency, frequency and flank pain.  Musculoskeletal: Positive for back pain. Negative for gait problem.  Skin: Negative for rash.  Neurological: Negative for weakness and headaches.  Psychiatric/Behavioral: Negative for confusion and decreased concentration. The patient is not nervous/anxious.     Objective:  BP 130/70 mmHg  Pulse 105  Temp(Src) 98.3 F (36.8 C) (Oral)  Resp 16  Ht 6\' 3"  (1.905 m)  Wt 246 lb (111.585 kg)  BMI 30.75 kg/m2  SpO2 96%  Physical Exam  Constitutional: He is oriented to person, place, and time. He appears well-developed and well-nourished.  HENT:  Head: Normocephalic and atraumatic.  Eyes: Conjunctivae are normal. Pupils are equal, round, and reactive to light.  Pulmonary/Chest: Effort normal.  Musculoskeletal: He  exhibits no edema.  Neurological: He is alert and oriented to person, place, and time.  Skin: Skin is dry.  Psychiatric: He has a normal mood and affect. His behavior is normal. Thought content normal.      Assessment & Plan:   Ormond was seen today for follow-up and other.  Diagnoses and all orders for this visit:  Sprain of lumbar region, subsequent encounter -     Ambulatory referral to Physical Therapy  Other orders -     traMADol (ULTRAM) 50 MG tablet; Take 2 tablets (100 mg total) by mouth every 6 (six)  hours as needed. -     naproxen sodium (ANAPROX DS) 550 MG tablet; Take 1 tablet (550 mg total) by mouth 2 (two) times daily with a meal.  I have discontinued Mr. Keep's methocarbamol. I have also changed his traMADol. Additionally, I am having him maintain his ibuprofen, cyclobenzaprine, HYDROcodone-acetaminophen, and naproxen sodium.  Meds ordered this encounter  Medications  . traMADol (ULTRAM) 50 MG tablet    Sig: Take 2 tablets (100 mg total) by mouth every 6 (six) hours as needed.    Dispense:  60 tablet    Refill:  1  . naproxen sodium (ANAPROX DS) 550 MG tablet    Sig: Take 1 tablet (550 mg total) by mouth 2 (two) times daily with a meal.    Dispense:  40 tablet    Refill:  0    Appropriate red flag conditions were discussed with the patient as well as actions that should be taken.  Patient expressed his understanding.  Follow-up: Return if symptoms worsen or fail to improve.  Carmelina Dane, MD

## 2015-05-06 NOTE — Patient Instructions (Signed)

## 2015-05-23 NOTE — Telephone Encounter (Signed)
Pt called requesting traMADol (ULTRAM) 50 MG tablet [[id number]] refill  CB# [phone]

## 2015-05-24 ENCOUNTER — Other Ambulatory Visit: Payer: Self-pay | Admitting: Emergency Medicine

## 2015-05-24 NOTE — Telephone Encounter (Signed)
Looks like he is starting PT. Does he need to RTC?

## 2015-05-24 NOTE — Telephone Encounter (Signed)
Pt called to check the status of tramodal refill req.  [phone] please call to advise or when ready for pick up

## 2015-05-25 NOTE — Telephone Encounter (Signed)
Rx faxed

## 2015-05-25 NOTE — Telephone Encounter (Signed)
Rx called in. Pt notified. 

## 2015-05-25 NOTE — Telephone Encounter (Signed)
Yes, lets say in 2 weeks

## 2015-05-27 ENCOUNTER — Telehealth: Payer: Self-pay

## 2015-05-27 NOTE — Telephone Encounter (Signed)
Patient brought in FMLA forms to be completed by Dr. Dareen PianoAnderson, I have filled out what I can from the OV notes as well as highlighted what needs to be completed. Please return to the FMLA/Disability box at the 102 check out desk within 5-7 business days. I will place in your box on 05/27/15.

## 2015-06-01 ENCOUNTER — Other Ambulatory Visit: Payer: Self-pay | Admitting: Emergency Medicine

## 2015-06-01 ENCOUNTER — Telehealth: Payer: Self-pay

## 2015-06-01 NOTE — Telephone Encounter (Signed)
No, correct?

## 2015-06-01 NOTE — Telephone Encounter (Signed)
Pt is checking on the refill request of his tramadal  Best number [phone]

## 2015-06-01 NOTE — Telephone Encounter (Signed)
We can refill it once.  Do you want to call it in???

## 2015-06-02 MED ORDER — TRAMADOL HCL 50 MG PO TABS: 100.0000 mg | ORAL_TABLET | Freq: Four times a day (QID) | ORAL | Status: DC | PRN

## 2015-06-02 NOTE — Telephone Encounter (Signed)
Sure. Rx sent in and advised pt on VM.

## 2015-06-08 ENCOUNTER — Other Ambulatory Visit: Payer: Self-pay | Admitting: Emergency Medicine

## 2015-06-08 NOTE — Telephone Encounter (Signed)
Pt called to check the status of rx refill for tramadol.  [phone] please call when ready for pick up

## 2015-06-09 ENCOUNTER — Other Ambulatory Visit: Payer: Self-pay | Admitting: Emergency Medicine

## 2015-06-10 NOTE — Telephone Encounter (Signed)
Are you able to get a controlled substance report?  He has a total knee arthroplasty in less than 3 weeks.  Would like to see if he is followed by anywhere else. If the last is tramadol by Dr. Dareen Piano, 06/02/2015--then please fill if possible.  Can you do this?

## 2015-06-15 ENCOUNTER — Inpatient Hospital Stay: Admission: RE | Admit: 2015-06-15 | Payer: Self-pay | Source: Ambulatory Visit

## 2015-06-16 ENCOUNTER — Other Ambulatory Visit: Payer: Self-pay | Admitting: Emergency Medicine

## 2015-06-16 NOTE — Telephone Encounter (Signed)
Tramadol Refill - w/i 24 hrs   [phone]

## 2015-06-18 ENCOUNTER — Ambulatory Visit (INDEPENDENT_AMBULATORY_CARE_PROVIDER_SITE_OTHER): Payer: Managed Care, Other (non HMO) | Admitting: Emergency Medicine

## 2015-06-18 VITALS — BP 136/80 | HR 105 | Temp 98.4°F | Resp 18 | Ht 75.0 in | Wt 242.6 lb

## 2015-06-18 DIAGNOSIS — M5431 Sciatica, right side: Secondary | ICD-10-CM | POA: Diagnosis not present

## 2015-06-18 DIAGNOSIS — M5441 Lumbago with sciatica, right side: Secondary | ICD-10-CM

## 2015-06-18 DIAGNOSIS — M79604 Pain in right leg: Secondary | ICD-10-CM

## 2015-06-18 DIAGNOSIS — G8929 Other chronic pain: Secondary | ICD-10-CM | POA: Insufficient documentation

## 2015-06-18 DIAGNOSIS — M545 Low back pain, unspecified: Secondary | ICD-10-CM

## 2015-06-18 MED ORDER — TRAMADOL HCL 50 MG PO TABS: 100.0000 mg | ORAL_TABLET | Freq: Four times a day (QID) | ORAL | Status: DC | PRN

## 2015-06-18 MED ORDER — GABAPENTIN 100 MG PO CAPS: ORAL_CAPSULE | ORAL | Status: DC

## 2015-06-18 NOTE — Progress Notes (Signed)
By signing my name below, I, Stann Ore, attest that this documentation has been prepared under the direction and in the presence of Lesle Chris, MD. Electronically Signed: Stann Ore, Scribe. 06/18/2015 , 9:49 AM .  Patient was seen in room 4 .  Chief Complaint:  Chief Complaint  Patient presents with  . Back Pain  . Leg Pain    left leg    HPI: Mathew Burns is a 45 y.o. male who reports to [facility] today complaining of back pain and right leg pain. He's been seen multiple times here for back pain. He had a L-spine film that showed grade 1 anterolisthesis of L3 on L4 and moderate disc disease.   Back & Right leg Pain He states that his job has causes his back to give out occasionally. In the past, he could take a couple days off for relief. He denies any back injury. He informs that the pain was worsened and pain radiates down to his right leg that started a week ago. He was working all last week. He also mentions having to squeeze his stomach to have some temporary relief on his back. He denies going to a back specialist. He hasn't had an MRI for his back.   He's been on tramadol and naproxen for his pain.   Left Knee He cancelled his total left knee replacement surgery because he was afraid to do the surgery.   Work He works at [employer].   No past medical history on file. No past surgical history on file. Social History   Social History  . Marital Status: Single    Spouse Name: N/A  . Number of Children: N/A  . Years of Education: N/A   Social History Main Topics  . Smoking status: Never Smoker   . Smokeless tobacco: Never Used  . Alcohol Use: No  . Drug Use: None  . Sexual Activity: Not Asked   Other Topics Concern  . None   Social History Narrative   No family history on file. No Known Allergies Prior to Admission medications   Medication Sig Start Date End Date Taking? Authorizing Provider  cyclobenzaprine (FLEXERIL) 10 MG tablet Take 1 tablet  (10 mg total) by mouth 3 (three) times daily as needed for muscle spasms. 04/01/15   Carmelina Dane, MD  HYDROcodone-acetaminophen (NORCO) 5-325 MG tablet Take 1 tablet by mouth every 6 (six) hours as needed. 04/14/15   Ethelda Chick, MD  ibuprofen (ADVIL,MOTRIN) 200 MG tablet Take 800 mg by mouth daily as needed. Reported on 05/06/2015    Historical Provider, MD  naproxen sodium (ANAPROX DS) 550 MG tablet Take 1 tablet (550 mg total) by mouth 2 (two) times daily with a meal. 05/06/15 05/05/16  Carmelina Dane, MD  traMADol (ULTRAM) 50 MG tablet Take 2 tablets (100 mg total) by mouth every 6 (six) hours as needed. 06/02/15   Carmelina Dane, MD     ROS:  Constitutional: negative for fever, chills, night sweats, weight changes, or fatigue  HEENT: negative for vision changes, hearing loss, congestion, rhinorrhea, ST, epistaxis, or sinus pressure Cardiovascular: negative for chest pain or palpitations Respiratory: negative for hemoptysis, wheezing, shortness of breath, or cough Abdominal: negative for abdominal pain, nausea, vomiting, diarrhea, or constipation Dermatological: negative for rash Musc: positive for myalgia (right leg), back pain Neurologic: negative for headache, dizziness, or syncope All other systems reviewed and are otherwise negative with the exception to those above and in the HPI.  PHYSICAL EXAM: Filed Vitals:   06/18/15 0926  BP: 136/80  Pulse: 105  Temp: 98.4 F (36.9 C)  Resp: 18   Body mass index is 30.32 kg/(m^2).   General: Alert, no acute distress HEENT:  Normocephalic, atraumatic, oropharynx patent. Eye: Nonie Hoyer [facility] Cardiovascular:  Regular rate and rhythm, no rubs murmurs or gallops.  No Carotid bruits, radial pulse intact. No pedal edema.  Respiratory: Clear to auscultation bilaterally.  No wheezes, rales, or rhonchi.  No cyanosis, no use of accessory musculature Abdominal: No organomegaly, abdomen is soft and non-tender, positive bowel  sounds. No masses. Musculoskeletal: Gait intact. Tenderness over right L5-S1, lower extremities strength 5/5, Negative straight leg raises bilaterally Skin: No rashes. Neurologic: Facial musculature symmetric. reflexes 2+ Psychiatric: Patient acts appropriately throughout our interaction.  Lymphatic: No cervical or submandibular lymphadenopathy Genitourinary/Anorectal: No acute findings  LABS:   EKG/XRAY:   Primary read interpreted by Dr. Cleta Alberts at [facility].   ASSESSMENT/PLAN: Pt has L3-L4 anterolisthesis presents today with sciatica right leg. Will try Neurontin to see if that helps with his discomfort; referral made for MRI of lumbar spine, ultram was refilled, referral made to Dr. Yevette Edwards for his help.  I personally performed the services described in this documentation, which was scribed in my presence. The recorded information has been reviewed and is accurate. Gross sideeffects, risk and benefits, and alternatives of medications d/w patient. Patient is aware that all medications have potential sideeffects and we are unable to predict every sideeffect or drug-drug interaction that may occur.  Lesle Chris MD 06/18/2015 10:05 AM

## 2015-06-18 NOTE — Telephone Encounter (Signed)
Daub refilled today during ov.

## 2015-06-24 ENCOUNTER — Other Ambulatory Visit: Payer: Self-pay | Admitting: Emergency Medicine

## 2015-06-27 NOTE — Telephone Encounter (Signed)
Pt came into 102 to check on RF status. I had check in advise pt that Dr Cleta Alberts is out of office and his box is being checked by other providers, and that I will check on this. Can someone else respond to this request please?

## 2015-06-28 NOTE — Telephone Encounter (Signed)
Pt LM on my VM asking again for RF to be done. I will mark it urgent.

## 2015-06-28 NOTE — Telephone Encounter (Signed)
Refill provided for patient as Dr. Cleta Alberts prescribed it at his last visit. [facility] checks out.

## 2015-06-28 NOTE — Telephone Encounter (Signed)
Called in Rx. Tried to notify pt but VM was full.

## 2015-06-29 NOTE — Telephone Encounter (Signed)
LMOM for pt that Rx was sent in yesterday, and I tried to call him then but his VM was full.

## 2015-06-30 ENCOUNTER — Ambulatory Visit: Admission: RE | Admit: 2015-06-30 | Payer: Managed Care, Other (non HMO) | Source: Ambulatory Visit | Admitting: Surgery

## 2015-06-30 ENCOUNTER — Encounter: Admission: RE | Payer: Self-pay | Source: Ambulatory Visit

## 2015-06-30 SURGERY — ARTHROPLASTY, KNEE, TOTAL
Anesthesia: Choice | Laterality: Bilateral

## 2015-07-02 ENCOUNTER — Inpatient Hospital Stay: Admission: RE | Admit: 2015-07-02 | Payer: Self-pay | Source: Ambulatory Visit

## 2015-07-12 ENCOUNTER — Other Ambulatory Visit: Payer: Self-pay | Admitting: Emergency Medicine

## 2015-07-12 ENCOUNTER — Telehealth: Payer: Self-pay

## 2015-07-12 MED ORDER — TRAMADOL HCL 50 MG PO TABS: ORAL_TABLET | ORAL | Status: DC

## 2015-07-12 NOTE — Telephone Encounter (Signed)
Meds were refilled 1. If he needs any more medication refills he will have to come to the office.

## 2015-07-12 NOTE — Telephone Encounter (Signed)
Pt would like to have a refill on TRAMADOL 50 MG. Please call [phone]

## 2015-07-23 ENCOUNTER — Ambulatory Visit (INDEPENDENT_AMBULATORY_CARE_PROVIDER_SITE_OTHER): Payer: Self-pay | Admitting: Emergency Medicine

## 2015-07-23 VITALS — BP 130/80 | HR 112 | Temp 98.2°F | Resp 18 | Ht 76.0 in | Wt 245.2 lb

## 2015-07-23 DIAGNOSIS — M545 Low back pain, unspecified: Secondary | ICD-10-CM

## 2015-07-23 DIAGNOSIS — M170 Bilateral primary osteoarthritis of knee: Secondary | ICD-10-CM | POA: Insufficient documentation

## 2015-07-23 DIAGNOSIS — M129 Arthropathy, unspecified: Secondary | ICD-10-CM

## 2015-07-23 MED ORDER — TRAMADOL HCL 50 MG PO TABS: ORAL_TABLET | ORAL | Status: DC

## 2015-07-23 NOTE — Patient Instructions (Signed)
[facility] Policy for Prescribing Controlled Substances (Revised 03/2012) 1. Prescriptions for controlled substances will be filled by ONE provider at [facility] with whom you have established and developed a plan for your care, including follow-up. 2. You are encouraged to schedule an appointment with your prescriber at our appointment center for follow-up visits whenever possible. 3. If you request a prescription for the controlled substance while at [facility] for an acute problem (with someone other than your regular prescriber), you MAY be given a ONE-TIME prescription for a 30-day supply of the controlled substance, to allow time for you to return to see your regular prescriber for additional prescriptions. 

## 2015-07-23 NOTE — Progress Notes (Addendum)
Patient ID: Mathew Burns, male   DOB: 24-Dec-1970, 45 y.o.   MRN: [id number]   By signing my name below, I, Mathew Burns, attest that this documentation has been prepared under the direction and in the presence of Mathew Gobble, MD Electronically Signed: [name], ED Scribe 07/23/2015 at 12:21 PM.  Chief Complaint:  Chief Complaint  Patient presents with  . Back Pain    x 2 months  . Knee Pain   HPI: Mathew Burns is a 45 y.o. male who reports to [facility] today complaining of constant knee pain for the past several months. Pt currently rates his pain 7/10. He reports associated back pain for the past 2 months as well. Pain is exacerbated with movement and palpation. He currently treats pain with 8 50 mgTramadol tablets and 100 mg Gabapentin at night. However, he states that Gabapentin only provides relief if he takes 4 tablets at night. Pt was supposed so follow-up with Mathew Burns and have a MRI after his last visit, but states that he lost his job was not able to afford to.   He now works part-time with his Mathew Burns. He is expecting to be approved for unemployment in mid April.   No past medical history on file. No past surgical history on file. Social History   Social History  . Marital Status: Single    Spouse Name: N/A  . Number of Children: N/A  . Years of Education: N/A   Social History Main Topics  . Smoking status: Never Smoker   . Smokeless tobacco: Never Used  . Alcohol Use: No  . Drug Use: Not on file  . Sexual Activity: Not on file   Other Topics Concern  . Not on file   Social History Narrative   No family history on file. No Known Allergies Prior to Admission medications   Medication Sig Start Date End Date Taking? Authorizing Provider  cyclobenzaprine (FLEXERIL) 10 MG tablet Take 1 tablet (10 mg total) by mouth 3 (three) times daily as needed for muscle spasms. 04/01/15   Mathew Dane, MD  gabapentin (NEURONTIN) 100 MG  capsule Take 1-2 capsules at night. If tolerated he can take 1 capsule  In the morning 1 capsule early afternoon and then 1-2 capsules at night. 06/18/15   Mathew Gobble, MD  HYDROcodone-acetaminophen (NORCO) 5-325 MG tablet Take 1 tablet by mouth every 6 (six) hours as needed. 04/14/15   Mathew Chick, MD  ibuprofen (ADVIL,MOTRIN) 200 MG tablet Take 800 mg by mouth daily as needed. Reported on 05/06/2015    Historical Provider, MD  naproxen sodium (ANAPROX DS) 550 MG tablet Take 1 tablet (550 mg total) by mouth 2 (two) times daily with a meal. 05/06/15 05/05/16  Mathew Dane, MD  traMADol (ULTRAM) 50 MG tablet TAKE 2 TABS BY MOUTH EVERY 6 HOURS AS NEEDED 07/12/15   Mathew Gobble, MD   ROS: The patient denies fevers, chills, night sweats, unintentional weight loss, chest pain, palpitations, wheezing, dyspnea on exertion, nausea, vomiting, abdominal pain, dysuria, hematuria, melena, numbness, weakness, or tingling. +back pain, +arthralgias  All other systems have been reviewed and were otherwise negative with the exception of those mentioned in the HPI and as above.    PHYSICAL EXAM: Filed Vitals:   07/23/15 1207  BP: 130/80  Pulse: 112  Temp: 98.2 F (36.8 C)  Resp: 18   Body mass index is 29.87 kg/(m^2).  General: Alert, no acute distress HEENT:  Normocephalic,  atraumatic, oropharynx patent. Eye: Mathew Burns, Mathew Burns Cardiovascular:  Regular rate and rhythm, no rubs murmurs or gallops.  No Carotid bruits, radial pulse intact. No pedal edema.  Respiratory: Clear to auscultation bilaterally.  No wheezes, rales, or rhonchi.  No cyanosis, no use of accessory musculature Abdominal: No organomegaly, abdomen is soft and non-tender, positive bowel sounds.  No masses. Musculoskeletal: Gait intact. Tenderness over lumbar spine but no weakness. Able to extend both legs without difficulty.  Skin: No rashes. Neurologic: Facial musculature symmetric. Psychiatric: Patient acts appropriately throughout  our interaction. Lymphatic: No cervical or submandibular lymphadenopathy  LABS:  EKG/XRAY:   [facility] read [facility].  ASSESSMENT/PLAN: I told Mathew Burns that the Ultram was an addictive drug. He has been getting her medication approximately 10-14 days. He takes a total of 8 per day. Referral made to pain management. I had referred him to Mathew Burns but states thathe lost his job and did not keep that appointment. He states he is currently not working. He denies any depression or suicidal thoughts.I personally performed the services described in this documentation, which was scribed in my presence. The recorded information has been reviewed and is accurate.   Gross sideeffects, risk and benefits, and alternatives of medications d/w patient. Patient is aware that all medications have potential sideeffects and we are unable to predict every sideeffect or drug-drug interaction that may occur.  Mathew ChrisSteven Delora Gravatt MD 07/23/2015 12:06 PM

## 2015-08-05 ENCOUNTER — Other Ambulatory Visit: Payer: Self-pay

## 2015-08-05 DIAGNOSIS — M545 Low back pain, unspecified: Secondary | ICD-10-CM

## 2015-08-05 DIAGNOSIS — M170 Bilateral primary osteoarthritis of knee: Secondary | ICD-10-CM

## 2015-08-05 NOTE — Telephone Encounter (Signed)
Patient request a refill on Tramadol 50 MG [phone]

## 2015-08-06 MED ORDER — TRAMADOL HCL 50 MG PO TABS: ORAL_TABLET | ORAL | Status: DC

## 2015-08-06 NOTE — Telephone Encounter (Signed)
Faxed in

## 2015-08-24 ENCOUNTER — Other Ambulatory Visit: Payer: Self-pay

## 2015-08-24 DIAGNOSIS — M545 Low back pain, unspecified: Secondary | ICD-10-CM

## 2015-08-24 DIAGNOSIS — M170 Bilateral primary osteoarthritis of knee: Secondary | ICD-10-CM

## 2015-08-24 NOTE — Telephone Encounter (Signed)
Pt needs a refill on Tremadol.  Please advise when ready  [phone]

## 2015-08-25 MED ORDER — TRAMADOL HCL 50 MG PO TABS: ORAL_TABLET | ORAL | Status: DC

## 2015-08-26 ENCOUNTER — Other Ambulatory Visit: Payer: Self-pay | Admitting: Emergency Medicine

## 2015-09-12 ENCOUNTER — Other Ambulatory Visit: Payer: Self-pay | Admitting: Emergency Medicine

## 2015-09-13 NOTE — Telephone Encounter (Signed)
Pt called again this morning requesting a refill on his TRAMADOL 50 MG. Please call [phone]     [facility] [address]

## 2015-09-13 NOTE — Telephone Encounter (Signed)
Dr. Cleta Albertsaub,  Is it ok to refill?  Thanks!

## 2015-09-14 ENCOUNTER — Other Ambulatory Visit: Payer: Self-pay | Admitting: Emergency Medicine

## 2015-09-14 DIAGNOSIS — M545 Low back pain, unspecified: Secondary | ICD-10-CM

## 2015-09-14 DIAGNOSIS — M170 Bilateral primary osteoarthritis of knee: Secondary | ICD-10-CM

## 2015-09-14 MED ORDER — TRAMADOL HCL 50 MG PO TABS: ORAL_TABLET | ORAL | Status: DC

## 2015-09-14 NOTE — Telephone Encounter (Signed)
Faxed and notified pt

## 2015-09-28 ENCOUNTER — Other Ambulatory Visit: Payer: Self-pay | Admitting: Emergency Medicine

## 2015-09-28 ENCOUNTER — Telehealth: Payer: Self-pay

## 2015-09-28 NOTE — Telephone Encounter (Signed)
Pt is needing a refill on tramadal   Best number [phone]

## 2015-09-29 ENCOUNTER — Other Ambulatory Visit: Payer: Self-pay | Admitting: Emergency Medicine

## 2015-09-29 DIAGNOSIS — M170 Bilateral primary osteoarthritis of knee: Secondary | ICD-10-CM

## 2015-09-29 DIAGNOSIS — M545 Low back pain, unspecified: Secondary | ICD-10-CM

## 2015-09-29 MED ORDER — TRAMADOL HCL 50 MG PO TABS: ORAL_TABLET | ORAL | Status: DC

## 2015-09-29 NOTE — Telephone Encounter (Signed)
I did give him 1 refill. He will have to be seen in our office if he wants any further refills. He was referred to pain management and referred to Dr. Bunnie Dominoumont ski. Please check on the status of these to referrals. He was given 1 refill on his Ultram but cannot have anymore until seen.

## 2015-09-29 NOTE — Addendum Note (Signed)
Addended by: Lesle ChrisAUB, Chandra Feger A on: 09/29/2015 12:25 PM   Modules accepted: Kipp BroodSmartSet

## 2015-09-30 NOTE — Telephone Encounter (Signed)
faxed

## 2015-09-30 NOTE — Telephone Encounter (Signed)
Left message for pt to call back  °

## 2015-10-05 ENCOUNTER — Other Ambulatory Visit: Payer: Self-pay | Admitting: Emergency Medicine

## 2015-10-05 NOTE — Telephone Encounter (Signed)
Per DR Cleta Albertsaub on 5/17 he would give him that refill but he needed to be seen for further refills.

## 2015-10-11 ENCOUNTER — Telehealth: Payer: Self-pay

## 2015-10-11 ENCOUNTER — Other Ambulatory Visit: Payer: Self-pay | Admitting: Emergency Medicine

## 2015-10-11 NOTE — Telephone Encounter (Signed)
Patient states his medications were not sent to the pharmacy on the 24th and would like to pick them up today.

## 2015-10-11 NOTE — Telephone Encounter (Signed)
Left message for pt to call back.  He has to RTC for more refills.

## 2015-10-11 NOTE — Telephone Encounter (Signed)
Pt is checking on status of his ultram refill request

## 2015-10-12 NOTE — Telephone Encounter (Signed)
Pt p/up. 

## 2015-10-19 ENCOUNTER — Other Ambulatory Visit: Payer: Self-pay | Admitting: Emergency Medicine

## 2015-10-27 ENCOUNTER — Other Ambulatory Visit: Payer: Self-pay

## 2015-10-27 NOTE — Telephone Encounter (Signed)
Patient is calling because the pharmacy never received the fax for tramadol from 6/8. Please resend!

## 2015-10-28 ENCOUNTER — Telehealth: Payer: Self-pay

## 2015-10-28 MED ORDER — TRAMADOL HCL 50 MG PO TABS: ORAL_TABLET | ORAL | Status: DC

## 2015-10-28 NOTE — Telephone Encounter (Signed)
Call patient. I sent in one more refill but I would like to see him next week.

## 2015-10-28 NOTE — Addendum Note (Signed)
Addended by: Sheppard PlumberBRIGGS, Tailer Volkert A on: 10/28/2015 02:10 PM   Modules accepted: Orders

## 2015-10-28 NOTE — Telephone Encounter (Signed)
I received a call from pharm stating that they did receive the Rx from the 8th and pt picked that one up.   Pt called back right afterward and clarified to operator that he has taken it all and needs a refill again. There was only enough medication for 1 week. Dr Cleta Albertsaub, please advise.

## 2015-10-28 NOTE — Telephone Encounter (Signed)
Called pt and LM that RF was faxed, but he needs to RTC to see Dr Cleta Albertsaub next week for further RFs.

## 2015-10-28 NOTE — Telephone Encounter (Signed)
Called in Rx as written 6/8, and notified pt on VM.

## 2015-11-03 ENCOUNTER — Telehealth: Payer: Self-pay

## 2015-11-03 NOTE — Telephone Encounter (Signed)
Pt last got RF on 6/16 and when I left message telling him we had sent it in, I advised that Dr Cleta Albertsaub said he needed to come in to be seen this week for the next refill. I called and LMOM for pt advising him of this and gave him Dr Ellis Parentsaub's schedule next day in on Monday. I advised that if he needs some before then, he will have to come in and see another provider and see if they are willing to Rx it for him in Dr Ellis Parentsaub's absence.

## 2015-11-03 NOTE — Telephone Encounter (Signed)
Patient is calling to request a refill for tramadol.  [phone]

## 2015-11-07 ENCOUNTER — Ambulatory Visit (INDEPENDENT_AMBULATORY_CARE_PROVIDER_SITE_OTHER): Payer: Self-pay | Admitting: Emergency Medicine

## 2015-11-07 VITALS — BP 116/70 | HR 85 | Temp 98.0°F | Resp 16 | Ht 76.0 in | Wt 243.2 lb

## 2015-11-07 DIAGNOSIS — M79604 Pain in right leg: Secondary | ICD-10-CM

## 2015-11-07 DIAGNOSIS — M129 Arthropathy, unspecified: Secondary | ICD-10-CM

## 2015-11-07 DIAGNOSIS — M545 Low back pain: Secondary | ICD-10-CM

## 2015-11-07 DIAGNOSIS — M170 Bilateral primary osteoarthritis of knee: Secondary | ICD-10-CM

## 2015-11-07 MED ORDER — TRAMADOL HCL 50 MG PO TABS: ORAL_TABLET | ORAL | Status: DC

## 2015-11-07 NOTE — Patient Instructions (Addendum)
[facility] Policy for Prescribing Controlled Substances (Revised 03/2012) 1. Prescriptions for controlled substances will be filled by ONE provider at [facility] with whom you have established and developed a plan for your care, including follow-up. 2. You are encouraged to schedule an appointment with your prescriber at our appointment center for follow-up visits whenever possible. 3. If you request a prescription for the controlled substance while at [facility] for an acute problem (with someone other than your regular prescriber), you MAY be given a ONE-TIME prescription for a 30-day supply of the controlled substance, to allow time for you to return to see your regular prescriber for additional prescriptions.     IF you received an x-ray today, you will receive an invoice from [facility]. Please contact [facility] at [phone] with questions or concerns regarding your invoice.   IF you received labwork today, you will receive an invoice from [facility]. Please contact [facility] at [phone] with questions or concerns regarding your invoice.   Our billing staff will not be able to assist you with questions regarding bills from these companies.  You will be contacted with the lab results as soon as they are available. The fastest way to get your results is to activate your My Chart account. Instructions are located on the last page of this paperwork. If you have not heard from us regarding the results in 2 weeks, please contact this office.      

## 2015-11-07 NOTE — Progress Notes (Signed)
Subjective:  This chart was scribed for Mathew Burns, by [name],scribe, at [facility].  This patient was seen in room 4 and the patient's care was started at 10:22 AM.   Chief Complaint  Patient presents with  . Follow-up    back pain,     Patient ID: Mathew HabermannSean Burns, male    DOB: 1970-08-13, 45 y.o.   MRN: [id number]  HPI HPI Comments: Mathew HabermannSean Burns is a 45 y.o. male who presents to the [facility] for a follow up regarding his back pain.  Patient has a history of back and knee pain which has been going on for the past several months.  Patient was set up to see an orthopedist, have an MRI and gone to pain management but has expressed in the past that he was not able to as he has lost his job.  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  Patient is down to 6 Ultrams per day and has two left over but states that he does have more constant back pain now. He has been doing his stretches and using heating pads frequently for relief.  Patient [facility] on July 10th.  He is willing to have his MRI completed once his insurance begins on the first of the next month. Patient is planning on having a double knee replacement once he has everything taken care of.  Patient has had injections in his knees in the past.     Patient Active Problem List   Diagnosis Date Noted  . Osteoarthritis of both knees 07/23/2015  . LBP radiating to right leg 06/18/2015   No past medical history on file. No past surgical history on file. No Known Allergies Prior to Admission medications   Medication Sig Start Date End Date Taking? Authorizing Provider  cyclobenzaprine (FLEXERIL) 10 MG tablet Take 1 tablet (10 mg total) by mouth 3 (three) times daily as needed for muscle spasms. Patient not  taking: Reported on 07/23/2015 04/01/15   [name], Burns  gabapentin (NEURONTIN) 100 MG capsule Take 1-2 capsules at night. If tolerated he can take 1 capsule  In the morning 1 capsule early afternoon and then 1-2 capsules at night. 06/18/15   [name], Burns  HYDROcodone-acetaminophen (NORCO) 5-325 MG tablet Take 1 tablet by mouth every 6 (six) hours as needed. Patient not taking: Reported on 07/23/2015 04/14/15   [name], Burns  ibuprofen (ADVIL,MOTRIN) 200 MG tablet Take 800 mg by mouth daily as needed. Reported on 07/23/2015    Historical Provider, Burns  naproxen sodium (ANAPROX DS) 550 MG tablet Take 1 tablet (550 mg total) by mouth 2 (two) times daily with a meal. Patient not taking: Reported on 07/23/2015 05/06/15 05/05/16  [name], Burns  traMADol [name] 50 MG tablet TAKE TWO TABLETS BY MOUTH EVERY 6 HOURS AS NEEDED 10/28/15   Collene GobbleSteven A Jaynee Winters, Burns   Social History   Social History  . Marital Status: Single    Spouse Name: N/A  . Number of Children: N/A  . Years of Education: N/A   Occupational History  . Not on file.   Social History Main Topics  . Smoking status: Never Smoker   . Smokeless tobacco: Never Used  . Alcohol Use: No  .  Drug Use: Not on file  . Sexual Activity: Not on file   Other Topics Concern  . Not on file   Social History Narrative       Review of Systems  Constitutional: Negative for fever and chills.  Eyes: Negative for pain, redness and itching.  Respiratory: Negative for cough and shortness of breath.   Gastrointestinal: Negative for nausea, vomiting and diarrhea.  Musculoskeletal: Positive for back pain and arthralgias. Negative for neck pain and neck stiffness.  Neurological: Negative for seizures, syncope and speech difficulty.       Objective:   Physical Exam  Filed Vitals:   11/07/15 1015  BP: 116/70  Pulse: 85  Temp: 98 F (36.7 C)  TempSrc: Oral  Resp: 16  Height: 6\' 4"  (1.93 m)  Weight: 243 lb 3.2 oz (110.315  kg)  SpO2: 99%     CONSTITUTIONAL: Well developed/well nourished HEAD: Normocephalic/atraumatic EYES: EOMI/PERRL SPINE/BACK:He has mild tenderness over the lower lumbar spine CV: S1/S2 noted, no murmurs/rubs/gallops noted LUNGS: L no apparent distress NEURO: Pt is awake/alert/appropriate, moves all extremitiesx4.  No facial droop.   EXTREMITIES: Knee relfexes 1+ , Ankle 2+, Severe degenerative changes of both knees.  SKIN: warm, color normal PSYCH: no abnormalities of mood noted, alert and oriented          Assessment & Plan:  [facility]. This appointment is July 10. He is taking 6 Ultram per 24 hours. He is under the care of Dr. [name] for bilateral severe osteoarthritis and may need bilateral knee replacement in the future. Once his insurance takes effect he will be able to have his MRI. I gave him a prescription for #84 Ultram to last him for 2 weeks with one refill. I then he [facility].I personally performed the services described in this documentation, which was scribed in my presence. The recorded information has been reviewed and is accurate.  Collene GobbleSteven A Philbert Ocallaghan, Burns

## 2015-12-02 ENCOUNTER — Other Ambulatory Visit: Payer: Self-pay | Admitting: Emergency Medicine

## 2015-12-02 ENCOUNTER — Telehealth: Payer: Self-pay | Admitting: Emergency Medicine

## 2015-12-02 DIAGNOSIS — M5441 Lumbago with sciatica, right side: Secondary | ICD-10-CM

## 2015-12-02 MED ORDER — TRAMADOL HCL 50 MG PO TABS: ORAL_TABLET | ORAL | Status: DC

## 2015-12-02 NOTE — Telephone Encounter (Signed)
I have sent in prescription. Patient needs to see me before further refills.

## 2015-12-02 NOTE — Telephone Encounter (Signed)
LMVM per Dr. Cleta Albertsaub that patient's RX was sent to pharmacy.  Also, patient needs to be seen within the next 2 weeks for additional refills.

## 2015-12-02 NOTE — Telephone Encounter (Signed)
error 

## 2015-12-02 NOTE — Telephone Encounter (Signed)
Mr. Mathew Burns called saying he'll be out of Tramadol by tomorrow and needs a refill sent to his pharmacy. Please give him a call if needed.  Pt's ph# [phone] Thank you.

## 2015-12-02 NOTE — Telephone Encounter (Signed)
Patient states he no longer needs a refill for tramodol

## 2016-01-06 ENCOUNTER — Other Ambulatory Visit: Payer: Self-pay | Admitting: Emergency Medicine

## 2016-01-06 DIAGNOSIS — M5441 Lumbago with sciatica, right side: Secondary | ICD-10-CM

## 2016-01-10 ENCOUNTER — Encounter: Payer: Self-pay | Admitting: Emergency Medicine

## 2016-01-10 ENCOUNTER — Ambulatory Visit (INDEPENDENT_AMBULATORY_CARE_PROVIDER_SITE_OTHER): Payer: Managed Care, Other (non HMO) | Admitting: Emergency Medicine

## 2016-01-10 VITALS — BP 118/84 | HR 99 | Temp 98.0°F | Resp 17 | Ht 76.0 in | Wt 237.0 lb

## 2016-01-10 DIAGNOSIS — Z800 Family history of malignant neoplasm of digestive organs: Secondary | ICD-10-CM | POA: Diagnosis not present

## 2016-01-10 DIAGNOSIS — M5441 Lumbago with sciatica, right side: Secondary | ICD-10-CM | POA: Diagnosis not present

## 2016-01-10 DIAGNOSIS — M129 Arthropathy, unspecified: Secondary | ICD-10-CM

## 2016-01-10 DIAGNOSIS — M545 Low back pain: Secondary | ICD-10-CM

## 2016-01-10 DIAGNOSIS — M79604 Pain in right leg: Secondary | ICD-10-CM

## 2016-01-10 DIAGNOSIS — Z125 Encounter for screening for malignant neoplasm of prostate: Secondary | ICD-10-CM | POA: Diagnosis not present

## 2016-01-10 DIAGNOSIS — Z0000 Encounter for general adult medical examination without abnormal findings: Secondary | ICD-10-CM | POA: Diagnosis not present

## 2016-01-10 DIAGNOSIS — M170 Bilateral primary osteoarthritis of knee: Secondary | ICD-10-CM

## 2016-01-10 LAB — COMPLETE METABOLIC PANEL WITH GFR
ALBUMIN: 4.6 g/dL (ref 3.6–5.1)
ALK PHOS: 71 U/L (ref 40–115)
ALT: 22 U/L (ref 9–46)
AST: 20 U/L (ref 10–40)
BILIRUBIN TOTAL: 0.4 mg/dL (ref 0.2–1.2)
BUN: 7 mg/dL (ref 7–25)
CO2: 26 mmol/L (ref 20–31)
Calcium: 9.9 mg/dL (ref 8.6–10.3)
Chloride: 100 mmol/L (ref 98–110)
Creat: 1 mg/dL (ref 0.60–1.35)
GFR, Est African American: >89 mL/min (ref >=60)
GLUCOSE: 115 mg/dL — AB (ref 65–99)
POTASSIUM: 4.2 mmol/L (ref 3.5–5.3)
SODIUM: 141 mmol/L (ref 135–146)
TOTAL PROTEIN: 7.6 g/dL (ref 6.1–8.1)

## 2016-01-10 LAB — POCT CBC
GRANULOCYTE PERCENT: 44.9 % (ref 37–80)
HCT, POC: 42.2 % — AB (ref 43.5–53.7)
HEMOGLOBIN: 14.3 g/dL (ref 14.1–18.1)
Lymph, poc: 2.7 (ref 0.6–3.4)
MCH: 26.5 pg — AB (ref 27–31.2)
MCHC: 33.8 g/dL (ref 31.8–35.4)
MCV: 78.5 fL — AB (ref 80–97)
MID (CBC): 0.5 (ref 0–0.9)
MPV: 6.8 fL (ref 0–99.8)
PLATELET COUNT, POC: 286 10*3/uL (ref 142–424)
POC Granulocyte: 2.6 (ref 2–6.9)
POC LYMPH PERCENT: 46.4 %L (ref 10–50)
POC MID %: 8.7 % (ref 0–12)
RBC: 5.38 M/uL (ref 4.69–6.13)
RDW, POC: 14.9 %
WBC: 5.8 10*3/uL (ref 4.6–10.2)

## 2016-01-10 LAB — POCT URINALYSIS DIP (MANUAL ENTRY)
BILIRUBIN UA: NEGATIVE
GLUCOSE UA: NEGATIVE
Ketones, POC UA: NEGATIVE
Leukocytes, UA: NEGATIVE
Nitrite, UA: NEGATIVE
Protein Ur, POC: NEGATIVE
SPEC GRAV UA: 1.015
UROBILINOGEN UA: 0.2
pH, UA: 7.5

## 2016-01-10 LAB — LIPID PANEL
Cholesterol: 213 mg/dL — ABNORMAL HIGH (ref 125–200)
HDL: 50 mg/dL (ref >=40)
LDL CALC: 140 mg/dL — AB (ref <130)
TRIGLYCERIDES: 117 mg/dL (ref <150)
Total CHOL/HDL Ratio: 4.3 Ratio (ref <=5.0)
VLDL: 23 mg/dL (ref <30)

## 2016-01-10 LAB — TSH: TSH: 1.33 m[IU]/L (ref 0.40–4.50)

## 2016-01-10 MED ORDER — TRAMADOL HCL 50 MG PO TABS: ORAL_TABLET | ORAL | 1 refills | Status: DC

## 2016-01-10 NOTE — Patient Instructions (Addendum)
[facility] Policy for Prescribing Controlled Substances (Revised 03/2012) 1. Prescriptions for controlled substances will be filled by ONE provider at [facility] with whom you have established and developed a plan for your care, including follow-up. 2. You are encouraged to schedule an appointment with your prescriber at our appointment center for follow-up visits whenever possible. 3. If you request a prescription for the controlled substance while at [facility] for an acute problem (with someone other than your regular prescriber), you MAY be given a ONE-TIME prescription for a 30-day supply of the controlled substance, to allow time for you to return to see your regular prescriber for additional prescriptions.    IF you received an x-ray today, you will receive an invoice from [facility]. Please contact [facility] at [phone] with questions or concerns regarding your invoice.   IF you received labwork today, you will receive an invoice from [employer]. Please contact [facility] at [phone] with questions or concerns regarding your invoice.   Our billing staff will not be able to assist you with questions regarding bills from these companies.  You will be contacted with the lab results as soon as they are available. The fastest way to get your results is to activate your My Chart account. Instructions are located on the last page of this paperwork. If you have not heard from [place] regarding the results in 2 weeks, please contact this office.      bus to work [employer], Male A healthy lifestyle and preventative care can promote health and wellness.  Maintain regular health, dental, and eye exams.  Eat a healthy diet. Foods like vegetables, fruits, whole grains, low-fat dairy products, and lean protein foods contain the nutrients you need and are low in calories. Decrease your intake of foods high in solid fats, added sugars, and salt. Get information  about a proper diet from your health care provider, if necessary.  Regular physical exercise is one of the most important things you can do for your health. Most adults should get at least 150 minutes of moderate-intensity exercise (any activity that increases your heart rate and causes you to sweat) each week. In addition, most adults need muscle-strengthening exercises on 2 or more days a week.   Maintain a healthy weight. The body mass index (BMI) is a screening tool to identify possible weight problems. It provides an estimate of body fat based on height and weight. Your health care provider can find your BMI and can help you achieve or maintain a healthy weight. For males 20 years and older:  A BMI below 18.5 is considered underweight.  A BMI of 18.5 to 24.9 is normal.  A BMI of 25 to 29.9 is considered overweight.  A BMI of 30 and above is considered obese.  Maintain normal blood lipids and cholesterol by exercising and minimizing your intake of saturated fat. Eat a balanced diet with plenty of fruits and vegetables. Blood tests for lipids and cholesterol should begin at age 47 and be repeated every 5 years. If your lipid or cholesterol levels are high, you are over age 52, or you are at high risk for heart disease, you may need your cholesterol levels checked more frequently.Ongoing high lipid and cholesterol levels should be treated with medicines if diet and exercise are not working.  If you smoke, find out from your health care provider how to quit. If you do not use tobacco, do not start.  Lung cancer screening is recommended for adults aged  55-80 years who are at high risk for developing lung cancer because of a history of smoking. A yearly low-dose CT scan of the lungs is recommended for people who have at least a 30-pack-year history of smoking and are current smokers or have quit within the past 15 years. A pack year of smoking is smoking an average of 1 pack of cigarettes a day  for 1 year (for example, a 30-pack-year history of smoking could mean smoking 1 pack a day for 30 years or 2 packs a day for 15 years). Yearly screening should continue until the smoker has stopped smoking for at least 15 years. Yearly screening should be stopped for people who develop a health problem that would prevent them from having lung cancer treatment.  If you choose to drink alcohol, do not have more than 2 drinks per day. One drink is considered to be 12 oz (360 mL) of beer, 5 oz (150 mL) of wine, or 1.5 oz (45 mL) of liquor.  Avoid the use of street drugs. Do not share needles with anyone. Ask for help if you need support or instructions about stopping the use of drugs.  High blood pressure causes heart disease and increases the risk of stroke. High blood pressure is more likely to develop in:  People who have blood pressure in the end of the normal range (100-139/85-89 mm Hg).  People who are overweight or obese.  People who are African American.  If you are 20-64 years of age, have your blood pressure checked every 3-5 years. If you are 25 years of age or older, have your blood pressure checked every year. You should have your blood pressure measured twice--once when you are at a hospital or clinic, and once when you are not at a hospital or clinic. Record the average of the two measurements. To check your blood pressure when you are not at a hospital or clinic, you can use:  An automated blood pressure machine at a pharmacy.  A home blood pressure monitor.  If you are 69-8 years old, ask your health care provider if you should take aspirin to prevent heart disease.  Diabetes screening involves taking a blood sample to check your fasting blood sugar level. This should be done once every 3 years after age 64 if you are at a normal weight and without risk factors for diabetes. Testing should be considered at a younger age or be carried out more frequently if you are overweight and  have at least 1 risk factor for diabetes.  Colorectal cancer can be detected and often prevented. Most routine colorectal cancer screening begins at the age of 82 and continues through age 64. However, your health care provider may recommend screening at an earlier age if you have risk factors for colon cancer. On a yearly basis, your health care provider may provide home test kits to check for hidden blood in the stool. A small camera at the end of a tube may be used to directly examine the colon (sigmoidoscopy or colonoscopy) to detect the earliest forms of colorectal cancer. Talk to your health care provider about this at age 34 when routine screening begins. A direct exam of the colon should be repeated every 5-10 years through age 28, unless early forms of precancerous polyps or small growths are found.  People who are at an increased risk for hepatitis B should be screened for this virus. You are considered at high risk for hepatitis B if:  You were born in a country where hepatitis B occurs often. Talk with your health care provider about which countries are considered high risk.  Your parents were born in a high-risk country and you have not received a shot to protect against hepatitis B (hepatitis B vaccine).  You have HIV or AIDS.  You use needles to inject street drugs.  You live with, or have sex with, someone who has hepatitis B.  You are a man who has sex with other men (MSM).  You get hemodialysis treatment.  You take certain medicines for conditions like cancer, organ transplantation, and autoimmune conditions.  Hepatitis C blood testing is recommended for all people born from [id number] through 1965 and any individual with known risk factors for hepatitis C.  Healthy men should no longer receive prostate-specific antigen (PSA) blood tests as part of routine cancer screening. Talk to your health care provider about prostate cancer screening.  Testicular cancer screening is not  recommended for adolescents or adult males who have no symptoms. Screening includes self-exam, a health care provider exam, and other screening tests. Consult with your health care provider about any symptoms you have or any concerns you have about testicular cancer.  Practice safe sex. Use condoms and avoid high-risk sexual practices to reduce the spread of sexually transmitted infections (STIs).  You should be screened for STIs, including gonorrhea and chlamydia if:  You are sexually active and are younger than 24 years.  You are older than 24 years, and your health care provider tells you that you are at risk for this type of infection.  Your sexual activity has changed since you were last screened, and you are at an increased risk for chlamydia or gonorrhea. Ask your health care provider if you are at risk.  If you are at risk of being infected with HIV, it is recommended that you take a prescription medicine daily to prevent HIV infection. This is called pre-exposure prophylaxis (PrEP). You are considered at risk if:  You are a man who has sex with other men (MSM).  You are a heterosexual man who is sexually active with multiple partners.  You take drugs by injection.  You are sexually active with a partner who has HIV.  Talk with your health care provider about whether you are at high risk of being infected with HIV. If you choose to begin PrEP, you should first be tested for HIV. You should then be tested every 3 months for as long as you are taking PrEP.  Use sunscreen. Apply sunscreen liberally and repeatedly throughout the day. You should seek shade when your shadow is shorter than you. Protect yourself by wearing long sleeves, pants, a wide-brimmed hat, and sunglasses year round whenever you are outdoors.  Tell your health care provider of new moles or changes in moles, especially if there is a change in shape or color. Also, tell your health care provider if a mole is larger  than the size of a pencil eraser.  A one-time screening for abdominal aortic aneurysm (AAA) and surgical repair of large AAAs by ultrasound is recommended for men aged 65-75 years who are current or former smokers.  Stay current with your vaccines (immunizations).   This information is not intended to replace advice given to you by your health care provider. Make sure you discuss any questions you have with your health care provider.   Document Released: 10/27/2007 Document Revised: 05/21/2014 Document Reviewed: 09/25/2010 Elsevier Interactive Patient Education 2016 Elsevier  Inc.  

## 2016-01-10 NOTE — Progress Notes (Signed)
Subjective:  This chart was scribed for Lesle Chris MD, by Veverly Fells, at [facility].  This patient was seen in room 14 and the patient's care was started at 12:04 PM.   Chief Complaint  Patient presents with  . Annual Exam     Patient ID: Mathew Burns, male    DOB: Oct 31, 1970, 45 y.o.   MRN: [id number]  HPI HPI Comments: Keanen Dohse is a 45 y.o. male who presents to the [facility] for an annual physical exam.   Back pain: Patient states that his back pain has been tolerable and he has been taking it "day by day".  He has been going to [employer] and rides a bike as much as he can.  Patient does not have an orthopedist and would like to be referred to one. Patient works in Clinical biochemist and instillation so he is trying to find ways that he can keep up with work by exercising.  He has lost 10 pounds so far with the spinning classes.   Knee: Patient has knee pain intermittently and has had knee surgery in the past.  He grew up playing basketball.   Patient will be going to [facility] for a week in four days.   Patient would not like a tetanus shot or flu shot today.   Patient has a family history of colon cancer ( mother and grandmother).    Patient Active Problem List   Diagnosis Date Noted  . Osteoarthritis of both knees 07/23/2015  . LBP radiating to right leg 06/18/2015   No past medical history on file. No past surgical history on file. No Known Allergies Prior to Admission medications   Medication Sig Start Date End Date Taking? Authorizing Provider  cyclobenzaprine (FLEXERIL) 10 MG tablet Take 1 tablet (10 mg total) by mouth 3 (three) times daily as needed for muscle spasms. Patient not taking: Reported on 07/23/2015 04/01/15   Carmelina Dane, MD  gabapentin (NEURONTIN) 100 MG capsule Take 1-2 capsules at night. If tolerated he can take 1 capsule  In the morning 1 capsule early afternoon and then  1-2 capsules at night. 06/18/15   Collene Gobble, MD  HYDROcodone-acetaminophen (NORCO) 5-325 MG tablet Take 1 tablet by mouth every 6 (six) hours as needed. 04/14/15   Ethelda Chick, MD  ibuprofen (ADVIL,MOTRIN) 200 MG tablet Take 800 mg by mouth daily as needed. Reported on 11/07/2015    Historical Provider, MD  traMADol (ULTRAM) 50 MG tablet TAKE TWO TABLETS BY MOUTH EVERY 8 HOURS AS NEEDED 12/02/15   Collene Gobble, MD   Social History   Social History  . Marital status: Single    Spouse name: N/A  . Number of children: N/A  . Years of education: N/A   Occupational History  . Not on file.   Social History Main Topics  . Smoking status: Never Smoker  . Smokeless tobacco: Never Used  . Alcohol use No  . Drug use: Unknown  . Sexual activity: Not on file   Other Topics Concern  . Not on file   Social History Narrative  . No narrative on file      Review of Systems  Constitutional: Negative for chills and fever.  Eyes: Negative for pain, redness and itching.  Respiratory: Negative for cough and shortness of breath.   Gastrointestinal: Negative for nausea and vomiting.  Musculoskeletal: Positive for arthralgias and back pain.  Skin: Negative  for color change.  Neurological: Negative for seizures and speech difficulty.       Objective:   Physical Exam Vitals:   01/10/16 1157  BP: 118/84  Pulse: 99  Resp: 17  Temp: 98 F (36.7 C)  TempSrc: Oral  SpO2: 99%  Weight: 237 lb (107.5 kg)  Height: 6\' 4"  (1.93 m)     CONSTITUTIONAL: Well developed/well nourished HEAD: Normocephalic/atraumatic EYES: EOMI/PERRL ENMT: Mucous membranes moist NECK: supple no meningeal signs SPINE/BACK:entire spine nontender CV: S1/S2 noted, no murmurs/rubs/gallops noted LUNGS: Lungs are clear to auscultation bilaterally, no apparent distress ABDOMEN: Small umbilical hernia. GU:no cva tenderness NEURO: Pt is awake/alert/appropriate, moves all extremitiesx4.  No facial droop.     EXTREMITIES: Degenerative changes of both knees.  SKIN:  1 by 1 cm birthmark on left leg.  PSYCH: no abnormalities of mood noted, alert and oriented to situation   Results for orders placed or performed in visit on 01/10/16  POCT CBC  Result Value Ref Range   WBC 5.8 4.6 - 10.2 K/uL   Lymph, poc 2.7 0.6 - 3.4   POC LYMPH PERCENT 46.4 10 - 50 %L   MID (cbc) 0.5 0 - 0.9   POC MID % 8.7 0 - 12 %M   POC Granulocyte 2.6 2 - 6.9   Granulocyte percent 44.9 37 - 80 %G   RBC 5.38 4.69 - 6.13 M/uL   Hemoglobin 14.3 14.1 - 18.1 g/dL   HCT, POC 69.642.2 (A) 29.543.5 - 53.7 %   MCV 78.5 (A) 80 - 97 fL   MCH, POC 26.5 (A) 27 - 31.2 pg   MCHC 33.8 31.8 - 35.4 g/dL   RDW, POC 28.414.9 %   Platelet Count, POC 286 142 - 424 K/uL   MPV 6.8 0 - 99.8 fL  POCT urinalysis dipstick  Result Value Ref Range   Color, UA yellow yellow   Clarity, UA clear clear   Glucose, UA negative negative   Bilirubin, UA negative negative   Ketones, POC UA negative negative   Spec Grav, UA 1.015    Blood, UA trace-intact (A) negative   pH, UA 7.5    Protein Ur, POC negative negative   Urobilinogen, UA 0.2    Nitrite, UA Negative Negative   Leukocytes, UA Negative Negative        Assessment & Plan:  Routine labs were done. He has a healthy lifestyle. Referral made to Dr. Althea CharonMcKinley regarding core muscle strengthening for his back and follow-up arthritis knees. Referral made for GI  early colon cancer screening.Ultram was decreased to 1 every 8 hours. He was given #90 tablets one refill.I personally performed the services described in this documentation, which was scribed in my presence. The recorded information has been reviewed and is accurate. He was given a copy of our drug policy.  Collene GobbleSteven A Christien Frankl, MD

## 2016-01-11 ENCOUNTER — Telehealth: Payer: Self-pay

## 2016-01-11 LAB — PSA: PSA: 0.5 ng/mL (ref <=4.0)

## 2016-01-11 NOTE — Telephone Encounter (Signed)
Mary chapman put the form in dr daub box to complete for patient

## 2016-01-25 NOTE — Progress Notes (Signed)
Patient advised.

## 2016-01-26 IMAGING — CR DG LUMBAR SPINE COMPLETE 4+V
5 series · 5 of 5 positions shown · non-contrast
Comparison: 04/26/2011

CLINICAL DATA: Low back pain.

EXAM:
LUMBAR SPINE - COMPLETE 4+ VIEW

[AP]
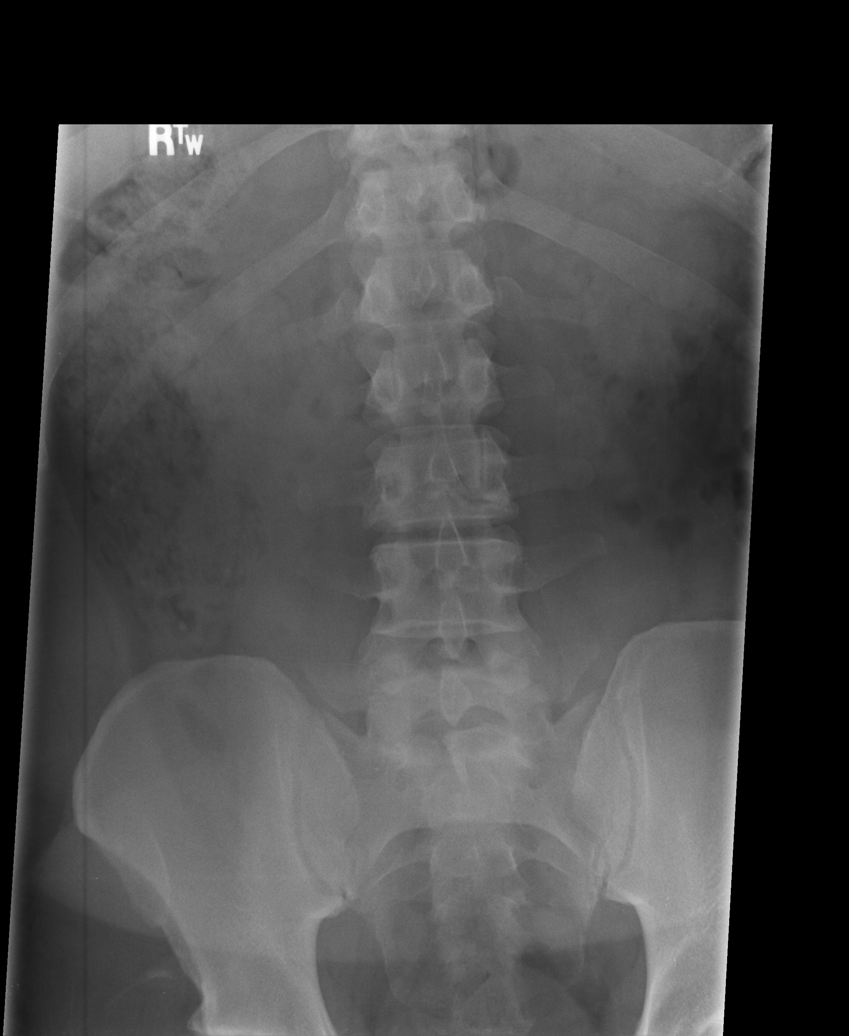

[rpo]
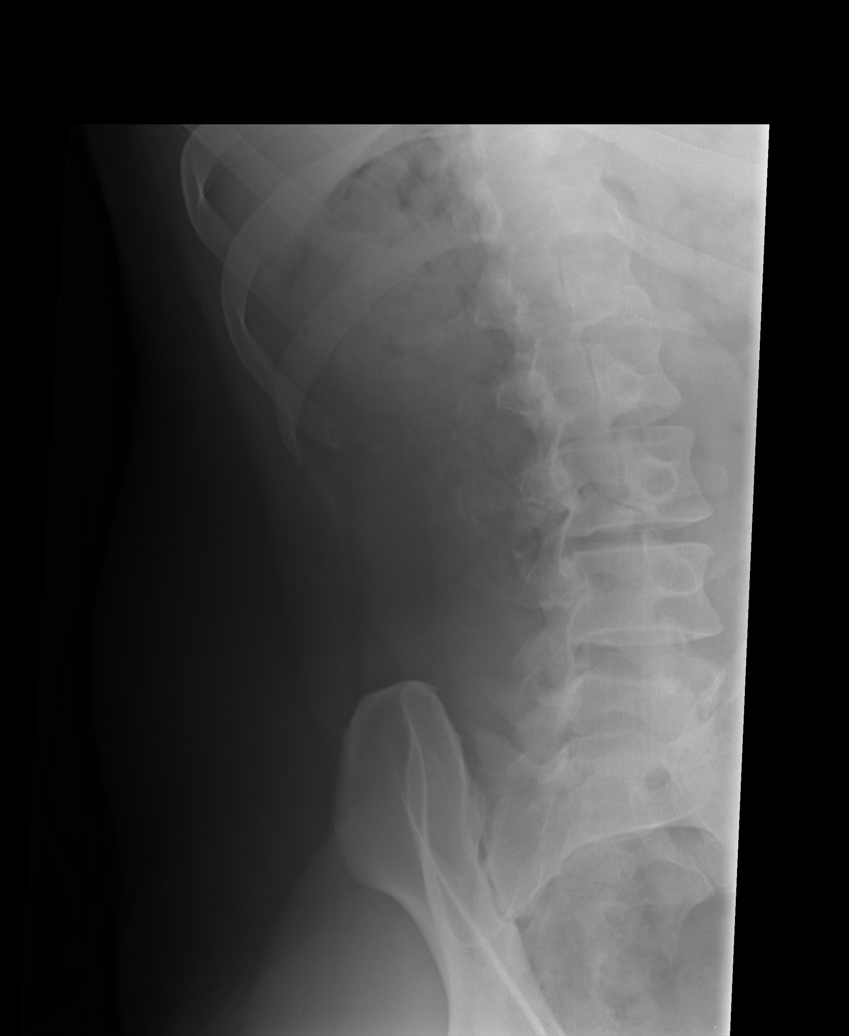

[lpo]
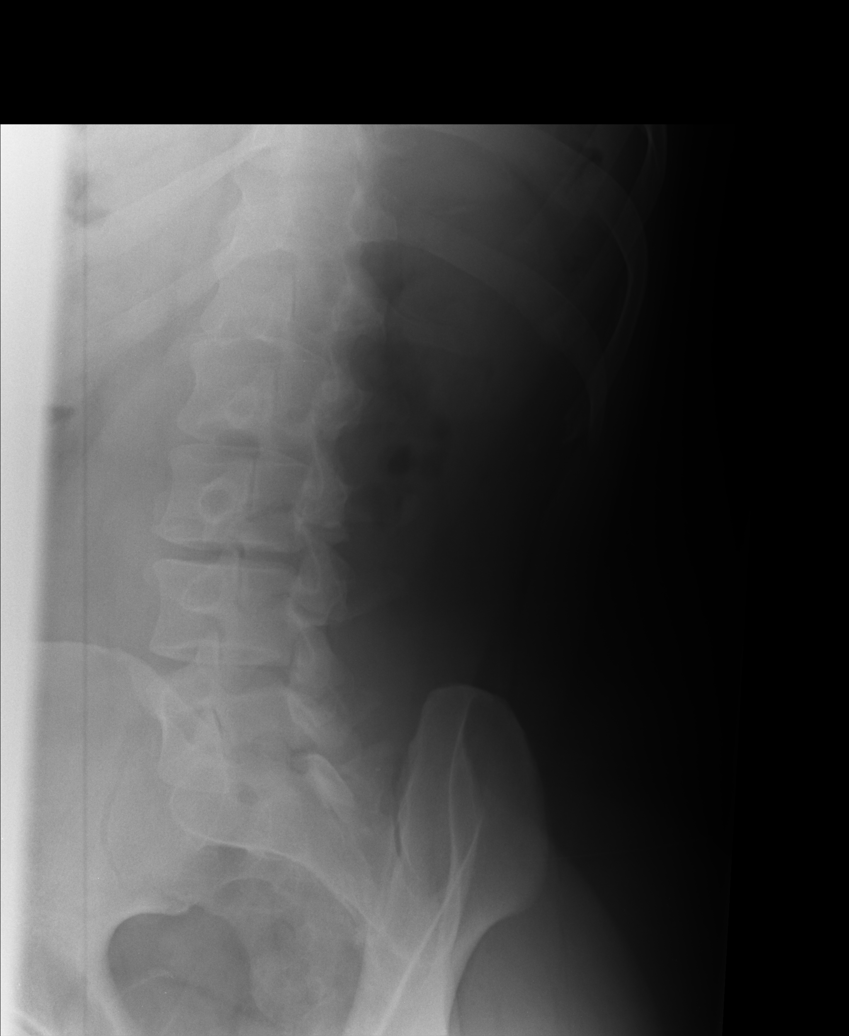

[lateral]
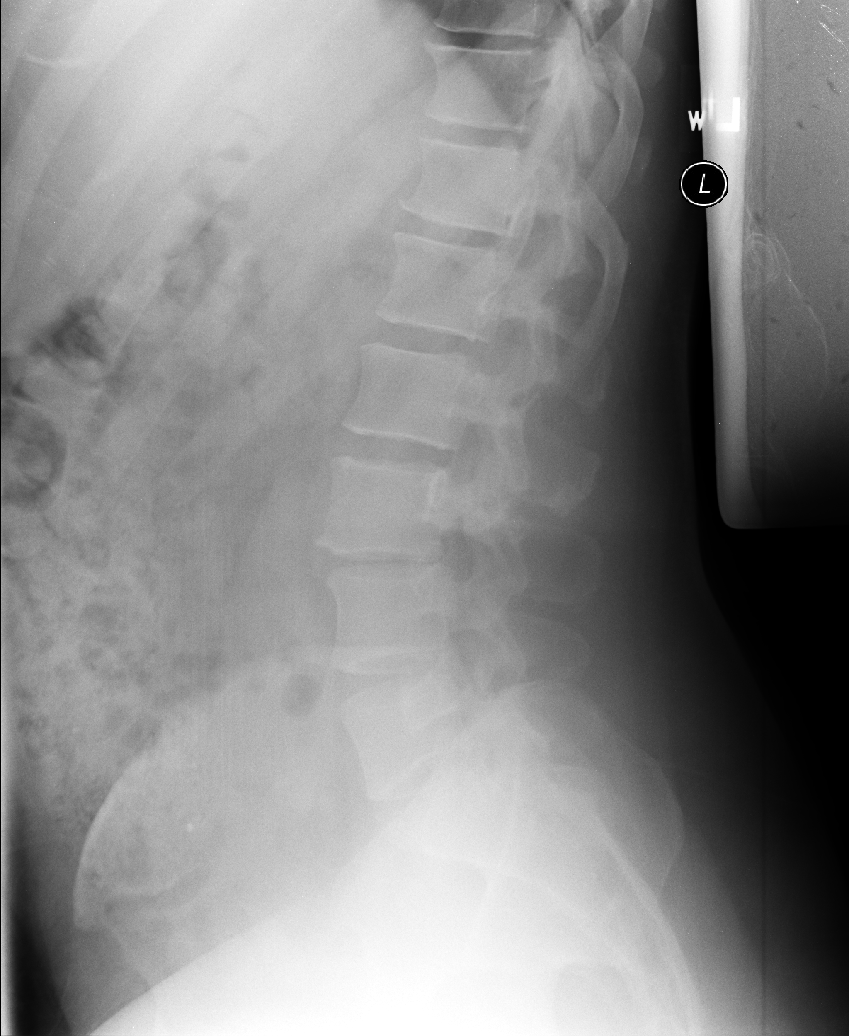

[l5 s1]
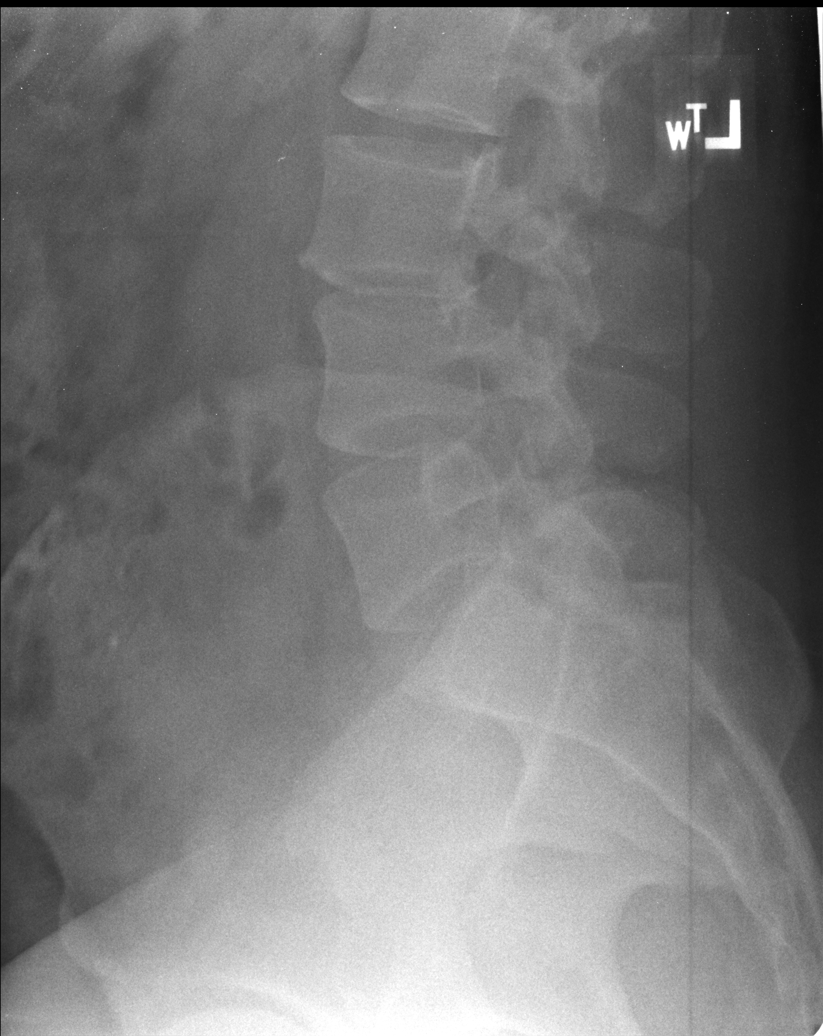

[5 of 5 positions shown; findings below may reference images not displayed]

FINDINGS: No fracture. Is a grade 1 anterolisthesis of L3 on L4. There is
moderate loss disc height at this level with small endplate
osteophytes.

No other spondylolisthesis. Remaining discs are relatively well
preserved in height.

Facet joints are well preserved.

Soft tissues are unremarkable.
IMPRESSION: 1. No fracture or acute finding.
2. Disc degenerative change at L3-L4 similar to the prior study.

## 2016-03-08 ENCOUNTER — Other Ambulatory Visit: Payer: Self-pay | Admitting: Emergency Medicine

## 2016-03-08 DIAGNOSIS — M5441 Lumbago with sciatica, right side: Secondary | ICD-10-CM

## 2016-03-12 ENCOUNTER — Other Ambulatory Visit: Payer: Self-pay

## 2016-03-12 DIAGNOSIS — F1990 Other psychoactive substance use, unspecified, uncomplicated: Secondary | ICD-10-CM

## 2016-03-12 DIAGNOSIS — F1910 Other psychoactive substance abuse, uncomplicated: Principal | ICD-10-CM

## 2016-03-12 NOTE — Telephone Encounter (Signed)
Patient needs his traMADol (ULTRAM) 50 MG tablet refilled.  His call back number is [phone]

## 2016-03-14 NOTE — Telephone Encounter (Signed)
I checked with pharm to see if they received the Rx that was written on 10/27 and they did not (it looks like the refill request was authorized w/out sending to provider, but no documentation that it had been faxed or called in - believe that new RN was not aware this is controlled and needed to go to the provider unless it was OKd verbally. I have made sure that the RN will be given this information for the future).  Can we refill for Daub pt? Last seen 8/29 for this med and given a 30 day supply w/1 RF then.

## 2016-03-16 DIAGNOSIS — F1910 Other psychoactive substance abuse, uncomplicated: Principal | ICD-10-CM

## 2016-03-16 DIAGNOSIS — F1990 Other psychoactive substance use, unspecified, uncomplicated: Secondary | ICD-10-CM | POA: Insufficient documentation

## 2016-03-16 NOTE — Telephone Encounter (Signed)
Upon review of  Controlled Substance Registry, pateint just received 24 hydrocodone from dentist on 03/15/16.  Prior to that the following: on 10/19 Tramadol #180 [place]; 10/3 Tramadol [place]; 9/27 Tramadol #90 from Dr. Cleta Albertsaub; 9/15 Tramadol #90 from Dr. Alverda SkeansPeng; 8/29 Tramdol #90 from Dr. Cleta Albertsaub; 8/7 Tramadol #40 from a different provider.  Due to patient receiving tramadol from multiple providers, no [facility] will be able to continue prescribing ANY controlled substances for him. Further, Dr. Cleta Albertsaub referred pt to Dr. Althea CharonMcKinley at his visit on 01/10/16; thus, we will no longer be managing his back pain.

## 2016-03-20 ENCOUNTER — Other Ambulatory Visit: Payer: Self-pay | Admitting: Emergency Medicine

## 2016-03-20 DIAGNOSIS — M5441 Lumbago with sciatica, right side: Secondary | ICD-10-CM

## 2016-03-21 NOTE — Telephone Encounter (Signed)
L/m with dr Lonn Georgiasmiths note. See dr Althea Charonmckinley for pain management. Pain meds can no longer be rxd from this office.

## 2016-03-28 NOTE — Telephone Encounter (Signed)
Opened in error

## 2017-02-01 ENCOUNTER — Ambulatory Visit: Payer: Managed Care, Other (non HMO) | Admitting: Family Medicine

## 2017-02-04 ENCOUNTER — Telehealth: Payer: Self-pay | Admitting: Family Medicine

## 2017-02-04 ENCOUNTER — Encounter: Payer: Self-pay | Admitting: Family Medicine

## 2017-02-04 ENCOUNTER — Ambulatory Visit (INDEPENDENT_AMBULATORY_CARE_PROVIDER_SITE_OTHER): Payer: Managed Care, Other (non HMO) | Admitting: Family Medicine

## 2017-02-04 VITALS — BP 144/82 | HR 91 | Temp 98.8°F | Resp 18 | Ht 76.0 in | Wt 249.2 lb

## 2017-02-04 DIAGNOSIS — M5432 Sciatica, left side: Secondary | ICD-10-CM

## 2017-02-04 DIAGNOSIS — M170 Bilateral primary osteoarthritis of knee: Secondary | ICD-10-CM

## 2017-02-04 NOTE — Progress Notes (Signed)
9/24/201812:45 PM  Mathew Burns 13-Apr-1971, 46 y.o. male [id number]  Chief Complaint  Patient presents with  . paperwork    FMLA    HPI:   Patient is a 46 y.o. male with past medical history significant for  Sciatica, DJD of knees and plantar fasciitis who presents today for FMLA paperwrok.  Patient reports long standing history of various MSK issues. He has been seeing Dr Laurian Brim at [place]. He has been receiving injections for his back, sciatica symptoms improved. Knee replacement being postponed due to age. OA and plantar fascitis being managed conservatively per patient. He reports that Dr Laurian Brim has written for certain work accommodations such as able to wear sneakers and higher desk. He also is in need of a raised chair. 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  He reports that most of his issues are in the morning and therefore tends to come in late as it sometimes takes him 1-2 hours longer than expected. Mostly his back and knees are very stiff and painful in the morning. On average he misses about 8 hours a week.  At this point he is at risk of losing his job if he does not get FMLA paperwork completed.  He has changed his diet in efforts to lose weight, He uses a yoga ball at home to sit. He goes to the gym 3-4 x week. He stretches daily.  Today is a good day, he has no pain.  Depression screen [facility] 2/9 02/04/2017 01/10/2016 11/07/2015  Decreased Interest 0 0 0  Down, Depressed, Hopeless 0 0 0  PHQ - 2 Score 0 0 0    No Known Allergies  Current Outpatient Prescriptions on File Prior to Visit  Medication Sig Dispense Refill  . meloxicam (MOBIC) 15 MG tablet Take 15 mg by mouth daily.    . traMADol (ULTRAM) 50 MG tablet TAKE ONE TABLET BY MOUTH EVERY 8 HOURS 90 tablet 1   No current facility-administered medications on file prior to visit.     History reviewed. No pertinent  past medical history.  History reviewed. No pertinent surgical history.  Social History  Substance Use Topics  . Smoking status: Never Smoker  . Smokeless tobacco: Never Used  . Alcohol use No    History reviewed. No pertinent family history.  Review of Systems  Constitutional: Negative for chills and fever.  Respiratory: Negative for cough and shortness of breath.   Cardiovascular: Negative for chest pain, palpitations and leg swelling.  Gastrointestinal: Negative for abdominal pain, nausea and vomiting.  Musculoskeletal: Positive for back pain and joint pain.  Neurological: Positive for tingling. Negative for focal weakness.     OBJECTIVE:  Blood pressure (!) 144/82, pulse 91, temperature 98.8 F (37.1 C), temperature source Oral, resp. rate 18, height  (1.93 m), weight 249 lb 3.2 oz (113 kg), SpO2 97 %.  Physical Exam  Constitutional: He is oriented to person, place, and time and well-developed, well-nourished, and in no distress.  HENT:  Head: Normocephalic and atraumatic.  Mouth/Throat: Oropharynx is clear and moist.  Eyes: Pupils are equal, round, and reactive to light. EOM are normal.  Neck: Neck supple.  Cardiovascular: Normal rate and regular rhythm.  Exam reveals no gallop and no friction rub.   No murmur heard. Pulmonary/Chest: Effort normal and breath sounds normal. He has no wheezes. He has no rales.  Musculoskeletal:  BACK: full ROM, non bony or paraspinal tenderness to palpation. Negative SLR sign. Knees: bony arthritic changes  Neurological: He is alert and oriented to person, place, and time. He has normal reflexes. Gait normal.  Skin: Skin is warm and dry.  Psychiatric: Mood and affect normal.      ASSESSMENT and PLAN:  1. Primary osteoarthritis of both knees  2. Sciatica of left side  Patient records from Dr Laurian Brim have been requested prior to completing FMLA paperwork.  Patient will be notified once paperwork completed.   Myles Lipps,  MD Primary Care at [facility] [address] [place], [place] [address] Ph.  [phone] Fax [phone]

## 2017-02-04 NOTE — Patient Instructions (Signed)
     IF you received an x-ray today, you will receive an invoice from [facility]. Please contact [facility] at [phone] with questions or concerns regarding your invoice.   IF you received labwork today, you will receive an invoice from [facility]. Please contact [facility] at [phone] with questions or concerns regarding your invoice.   Our billing staff will not be able to assist you with questions regarding bills from these companies.  You will be contacted with the lab results as soon as they are available. The fastest way to get your results is to activate your My Chart account. Instructions are located on the last page of this paperwork. If you have not heard from us regarding the results in 2 weeks, please contact this office.     

## 2017-02-04 NOTE — Telephone Encounter (Signed)
Patient needs FMLA forms completed by Dr Leretha Pol, I could not complete the forms because there were no OV notes in his chart. I have faxed Dr Fransisca Kaufmann office in order to get his medical records we are waiting for those to come through once they do I will place them in your box at [address].  Please complete all the highlighted areas on the FMLA forms and return them to the FMLA/Disability desk in the back office at the [address] building within 5-7 business days. Thank you!

## 2017-02-07 NOTE — Telephone Encounter (Signed)
Completed paperwork on your desk. Thanks for getting those records!

## 2017-02-08 DIAGNOSIS — Z0271 Encounter for disability determination: Secondary | ICD-10-CM

## 2017-02-08 NOTE — Telephone Encounter (Signed)
Paperwork scanned and faxed to [place] on 02/08/17

## 2017-02-28 ENCOUNTER — Telehealth: Payer: Self-pay | Admitting: Family Medicine

## 2017-02-28 NOTE — Telephone Encounter (Signed)
Pt is needing a refill on his tramadal  Best number [phone]

## 2017-03-05 MED ORDER — TRAMADOL HCL 50 MG PO TABS: 50.0000 mg | ORAL_TABLET | Freq: Three times a day (TID) | ORAL | 0 refills | Status: DC

## 2017-03-05 NOTE — Telephone Encounter (Signed)
Please advise 

## 2017-03-05 NOTE — Telephone Encounter (Signed)
Unable to pmp this patient. I will refill tramadol once, rx faxed with hand signature. if he wants for me to be main prescriber, which I have no issues with, then he needs to see me to establish controlled substance agreement, etc. As required by current laws. thanks

## 2017-03-08 NOTE — Telephone Encounter (Signed)
LVM to advise pt

## 2017-04-09 ENCOUNTER — Telehealth: Payer: Self-pay | Admitting: Family Medicine

## 2017-04-09 NOTE — Telephone Encounter (Signed)
Patient needs ADA forms completed for his job to make his accommodations permanent so he doesn't have to keep getting the forms completed every 6 months. I will place the forms in Dr Adela GlimpseSantiago's box on 04/09/17 please return to the FMLA/Disability desk in the back office at 104 within 5-7 business days. Thank you!

## 2017-04-15 NOTE — Telephone Encounter (Signed)
Paperwork scanned and faxed on 04/15/17

## 2017-04-16 ENCOUNTER — Telehealth: Payer: Self-pay | Admitting: Family Medicine

## 2017-04-16 NOTE — Telephone Encounter (Signed)
Copied from CRM [id number]. Topic: Quick Communication - See Telephone Encounter >> Apr 16, 2017  3:08 PM Rudi CocoLathan, Yana Schorr M, NT wrote: CRM for notification. See Telephone encounter for:   04/16/17. Pt calling to see when he can come by office to pick up form for renewal of his handicap sign. He has to take that to be turned in no fax number. Please give pt. A call when that is ready. [phone]

## 2017-06-03 ENCOUNTER — Telehealth: Payer: Self-pay | Admitting: Family Medicine

## 2017-06-03 NOTE — Telephone Encounter (Signed)
Patients forms are scanned into his chart under the media tab his FMLA and his handicapp sticker form---if he needs a copy of it all we have to do is print it out and give it to the patient.

## 2017-06-03 NOTE — Telephone Encounter (Signed)
Pt came in wanting his paperwork for his handicap sticker. He states that he filled out all the paperwork along with his FMLA papers. He filled out the paperwork over a month ago. Pt wants to be called when the papers are located. His telephone number was updated [phone].

## 2017-06-10 NOTE — Telephone Encounter (Signed)
See note below

## 2017-08-05 ENCOUNTER — Ambulatory Visit (INDEPENDENT_AMBULATORY_CARE_PROVIDER_SITE_OTHER): Payer: Managed Care, Other (non HMO) | Admitting: Family Medicine

## 2017-08-05 ENCOUNTER — Encounter: Payer: Self-pay | Admitting: Family Medicine

## 2017-08-05 ENCOUNTER — Other Ambulatory Visit: Payer: Self-pay

## 2017-08-05 ENCOUNTER — Ambulatory Visit: Payer: Managed Care, Other (non HMO) | Admitting: Family Medicine

## 2017-08-05 VITALS — BP 124/78 | HR 99 | Temp 98.7°F | Ht 76.0 in | Wt 230.0 lb

## 2017-08-05 DIAGNOSIS — M170 Bilateral primary osteoarthritis of knee: Secondary | ICD-10-CM

## 2017-08-05 DIAGNOSIS — G8929 Other chronic pain: Secondary | ICD-10-CM

## 2017-08-05 DIAGNOSIS — M5441 Lumbago with sciatica, right side: Secondary | ICD-10-CM | POA: Diagnosis not present

## 2017-08-05 DIAGNOSIS — Z791 Long term (current) use of non-steroidal anti-inflammatories (NSAID): Secondary | ICD-10-CM

## 2017-08-05 DIAGNOSIS — M549 Dorsalgia, unspecified: Secondary | ICD-10-CM

## 2017-08-05 NOTE — Progress Notes (Signed)
3/25/20193:07 PM  Mathew Burns 1970-12-10, 47 y.o. male [id number]  Chief Complaint  Patient presents with  . Back Pain    due to sitting chairs for long period of time. The chairs are not anatomically correct for the height he is. Needing FMLA and DMV forms filled. Needing a chair that will allow him to perform at work w/o taking break due to knew and back pain    HPI:   Patient is a 47 y.o. male with past medical history significant for DDD of spine and bilateral end stage OA  who presents today for request of renewal of accommodations for work and [employer] paperwork.   He works in a call center, sitting for long periods of time, work with a computer He is 6'4" and got a raised desk and a higher chair.  However the chair's head rest is at the level of his upper back/neck. Ever since then he has been having upper back pain, radiating down to his low back, requiring him to take more breaks than allowed. This new chair is also while higher still of not right height and he still sits lower than his knees. Again this causes stress on his low back and knees.  He has not seen ortho in over a year as he was being able to maintain current level of function with home exercise program and medications.  He also has a disability placard that helps given his knees hurt and are stiff on most days making it difficult for him to walk the non disability parking lot distance into his office without having to rest.  He continues to struggle to get into the office on time on days when he is having a really bad flare up, as those days he needs to take the time to do heating packs and stretch. He takes meloxicam +/- tramadol on those days.  He has had epidural injections to spine and steroid and hyaluronic injections to knees in the past  Depression screen [facility] 2/9 08/05/2017 02/04/2017 01/10/2016  Decreased Interest 0 0 0  Down, Depressed, Hopeless 0 0 0  PHQ - 2 Score 0 0 0    No Known Allergies  Prior to  Admission medications   Medication Sig Start Date End Date Taking? Authorizing Provider  meloxicam (MOBIC) 15 MG tablet Take 15 mg by mouth daily.   Yes [provider]  traMADol (ULTRAM) 50 MG tablet Take 1 tablet (50 mg total) by mouth every 8 (eight) hours. 03/05/17  Yes Myles Lipps, MD    History reviewed. No pertinent past medical history.  History reviewed. No pertinent surgical history.  Social History   Tobacco Use  . Smoking status: Never Smoker  . Smokeless tobacco: Never Used  Substance Use Topics  . Alcohol use: No    History reviewed. No pertinent family history.  Review of Systems  Constitutional: Negative for chills and fever.  Respiratory: Negative for cough and shortness of breath.   Cardiovascular: Negative for chest pain, palpitations and leg swelling.  Gastrointestinal: Negative for abdominal pain, nausea and vomiting.  Musculoskeletal: Positive for back pain and joint pain. Negative for falls.  Neurological: Positive for tingling. Negative for focal weakness.     OBJECTIVE:  Blood pressure 124/78, pulse 99, temperature 98.7 F (37.1 C), temperature source Oral, height 6\' 4"  (1.93 m), weight 230 lb (104.3 kg), SpO2 100 %.  Physical Exam  Constitutional: He is oriented to person, place, and time and well-developed, well-nourished, and in  no distress.  HENT:  Head: Normocephalic and atraumatic.  Mouth/Throat: Oropharynx is clear and moist.  Eyes: Pupils are equal, round, and reactive to light. EOM are normal.  Neck: Muscular tenderness present. No spinous process tenderness present. Normal range of motion present.  Cardiovascular: Normal rate and regular rhythm. Exam reveals no gallop and no friction rub.  No murmur heard. Pulmonary/Chest: Effort normal and breath sounds normal. He has no wheezes. He has no rales.  Musculoskeletal:       Thoracic back: He exhibits tenderness (paraspinals, trapezius) and bony tenderness. He exhibits no  spasm.  BACK: full ROM, non bony or paraspinal tenderness to palpation. Negative SLR sign. Knees: bony arthritic changes  Neurological: He is alert and oriented to person, place, and time. He has normal reflexes. Gait normal.  Skin: Skin is warm and dry.  Psychiatric: Mood and affect normal.     ASSESSMENT and PLAN  1. Upper back pain 2. Primary osteoarthritis of both knees 3. Chronic midline low back pain with right-sided sciatica 4. Encounter for long-term (current) use of NSAIDs - Basic metabolic panel  Patient continues to struggle with accommodations at work, referring to PT to see if they are able to assist in recommendations for ergonomic work environment. Currently not under ortho care as patient not interested in surgical interventions, he continues to work on conservative measures. FMLA and disability placard paperwork will be completed. Patient will be notified when to pickup.  Return in about 6 months (around 02/05/2018).    [name], MD [facility] at [facility] [address] [place], [place] [address] Ph.  [phone] Fax [phone]

## 2017-08-05 NOTE — Patient Instructions (Signed)
     IF you received an x-ray today, you will receive an invoice from [facility]. Please contact [facility] at [phone] with questions or concerns regarding your invoice.   IF you received labwork today, you will receive an invoice from [facility]. Please contact [facility] at [phone] with questions or concerns regarding your invoice.   Our billing staff will not be able to assist you with questions regarding bills from these companies.  You will be contacted with the lab results as soon as they are available. The fastest way to get your results is to activate your My Chart account. Instructions are located on the last page of this paperwork. If you have not heard from us regarding the results in 2 weeks, please contact this office.     

## 2017-08-06 ENCOUNTER — Telehealth: Payer: Self-pay | Admitting: Family Medicine

## 2017-08-06 LAB — BASIC METABOLIC PANEL
BUN/Creatinine Ratio: 12 (ref 9–20)
BUN: 11 mg/dL (ref 6–24)
CO2: 23 mmol/L (ref 20–29)
Calcium: 9.4 mg/dL (ref 8.7–10.2)
Chloride: 103 mmol/L (ref 96–106)
Creatinine, Ser: 0.9 mg/dL (ref 0.76–1.27)
GFR calc Af Amer: 117 mL/min/{1.73_m2} (ref >59)
GFR calc non Af Amer: 101 mL/min/{1.73_m2} (ref >59)
Glucose: 83 mg/dL (ref 65–99)
Potassium: 4.5 mmol/L (ref 3.5–5.2)
Sodium: 143 mmol/L (ref 134–144)

## 2017-08-06 NOTE — Telephone Encounter (Signed)
Patient needs FMLA forms updated for the next 6 months for his back issues. I have completed the forms based off the OV notes on 08/05/17 and the other FMLA forms that are scanned in. I will place the forms in Dr Adela GlimpseSantiago's box on 08/06/17 please sign the forms and return them to the FMLA/Disability desk within 5-7 business days. Thank you!

## 2017-08-08 NOTE — Telephone Encounter (Signed)
Forms signed and placed back on your desk. thanks

## 2017-08-09 NOTE — Telephone Encounter (Signed)
Paperwork scanned and faxed on 08/09/17

## 2017-08-14 ENCOUNTER — Other Ambulatory Visit: Payer: Self-pay | Admitting: Family Medicine

## 2017-08-14 NOTE — Telephone Encounter (Signed)
Pt requesting a refill of Tramadol. Pt also wants to make provider aware that he has stopped taking Meloxicam recently.    LOV: 08/05/17   Dr. Monia SabalSantiago   [facility] [address]

## 2017-08-14 NOTE — Telephone Encounter (Addendum)
Copied from CRM #[id number]. Topic: Quick Communication - See Telephone Encounter >> Aug 14, 2017  6:11 PM Trula SladeWalter, Linda F wrote: CRM for notification. See Telephone encounter for: 08/14/17. Patient would like a refill on his traMADol (ULTRAM) 50 MG tablet medication and have it sent to his preferred pharmacy [facility] [address].  The patient also want the provider to know he has stopped taking the  meloxicam (MOBIC) 15 MG tablet medication recently.

## 2017-08-20 NOTE — Telephone Encounter (Signed)
Refused as medication was refilled by me on 08/14/2017 and prescription has been picked up per [place] CSR

## 2017-09-02 ENCOUNTER — Telehealth: Payer: Self-pay | Admitting: Family Medicine

## 2017-09-02 NOTE — Telephone Encounter (Signed)
Pt came in today and dropped off a few extra papers for FMLA. I have placed the papers in Caitlin's inbox on her desk.

## 2017-09-03 NOTE — Telephone Encounter (Signed)
[facility]work [facility] the [facility] on 4/23

## 2017-09-05 NOTE — Telephone Encounter (Signed)
noted 

## 2017-09-11 ENCOUNTER — Other Ambulatory Visit: Payer: Self-pay | Admitting: Family Medicine

## 2017-09-12 NOTE — Telephone Encounter (Signed)
Patient is requesting a refill of the following medications: Requested Prescriptions   Pending Prescriptions Disp Refills  . traMADol (ULTRAM) 50 MG tablet [Pharmacy Med Name: TRAMADOL  TAB] 90 tablet 0    Sig: TAKE 1 TABLET BY MOUTH EVERY 8 HOURS    Date of patient request: 09/11/2017/   Last office visit: 08/05/2017 Date of last refill: 03/05/2018 Last refill amount: 90 Follow up time period per chart: 6 months

## 2017-10-14 ENCOUNTER — Telehealth: Payer: Self-pay | Admitting: Family Medicine

## 2017-10-14 NOTE — Telephone Encounter (Signed)
Rx refill request: Tramadol 50 mg    Last filled: 09/13/17  LOV: 08/05/17  PCP: Leretha PolSantiago  Pharmacy: verified

## 2017-10-14 NOTE — Telephone Encounter (Signed)
Medication pended LOV 08/05/17 No upcoming appt

## 2017-10-18 ENCOUNTER — Telehealth: Payer: Self-pay | Admitting: Family Medicine

## 2017-10-18 NOTE — Telephone Encounter (Unsigned)
Copied from CRM [id number]. Topic: Quick Communication - Rx Refill/Question >> Oct 18, 2017  1:09 PM Burns, Mathew wrote: Medication: traMADol (ULTRAM) 50 MG tablet   Preferred Pharmacy (with phone number or street name): [facility] [address] - [place], [place] - [address] [phone] (Phone) [phone] (Fax)   Agent: Please be advised that RX refills may take up to 3 business days. We ask that you follow-up with your pharmacy.

## 2017-10-18 NOTE — Telephone Encounter (Signed)
Duplicate encounter. See refill request from 6/3

## 2017-10-24 NOTE — Telephone Encounter (Signed)
Patient has been waiting on his Tramadol prescription for a while.  He is calling in for a status update.

## 2017-10-24 NOTE — Telephone Encounter (Signed)
Medication was sent 6/11 by Dr. Leretha PolSantiago LMOVM for pharmacy to ensure med is filled (called after hours).

## 2017-11-14 ENCOUNTER — Other Ambulatory Visit: Payer: Self-pay | Admitting: Family Medicine

## 2017-11-15 NOTE — Telephone Encounter (Signed)
Tramadol refill Last Refill: 10/22/17 #90 Last OV: 08/05/17 PCP: Dr. Leretha PolSantiago

## 2017-11-17 NOTE — Telephone Encounter (Signed)
Refill req Tramadol sent to Dr. Leretha PolSantiago

## 2017-11-18 NOTE — Telephone Encounter (Signed)
Med refilled. Please schedule patient for routine followup in September (that would be his 6 month followup) thanks.

## 2017-12-24 ENCOUNTER — Telehealth: Payer: Self-pay | Admitting: Family Medicine

## 2017-12-24 NOTE — Telephone Encounter (Signed)
tramadol refill Last Refill:11/18/17 # 90 no RF Last OV: 08/05/17 PCP: Koren ShiverIrma Santiago MD [facility] [phone]

## 2017-12-26 NOTE — Telephone Encounter (Signed)
Please schedule  Patient due for 6 months followup Last appt march 25th

## 2017-12-26 NOTE — Telephone Encounter (Signed)
[place] csr reviewed, med refilled 

## 2017-12-27 ENCOUNTER — Telehealth: Payer: Self-pay | Admitting: Family Medicine

## 2017-12-27 NOTE — Telephone Encounter (Signed)
Please call patient and set him up an apt for 6 month follow up per Dr Adela GlimpseSantiago's notes

## 2017-12-27 NOTE — Telephone Encounter (Signed)
Called pt to try and make a 6 month F/U per Dr. Adela GlimpseSantiago's notes. 6 months would be around September 25th.  When pt calls back, please schedule him for a OV - 6 month F/U with Dr. Leretha PolSantiago at his convenience.  Thank you!

## 2017-12-30 NOTE — Telephone Encounter (Signed)
Pt has appt on 12/31/17

## 2017-12-31 ENCOUNTER — Ambulatory Visit (INDEPENDENT_AMBULATORY_CARE_PROVIDER_SITE_OTHER): Payer: Managed Care, Other (non HMO) | Admitting: Family Medicine

## 2017-12-31 ENCOUNTER — Other Ambulatory Visit: Payer: Self-pay

## 2017-12-31 ENCOUNTER — Encounter: Payer: Self-pay | Admitting: Family Medicine

## 2017-12-31 VITALS — BP 130/83 | HR 91 | Temp 98.3°F | Ht 76.0 in | Wt 216.4 lb

## 2017-12-31 DIAGNOSIS — M5441 Lumbago with sciatica, right side: Secondary | ICD-10-CM

## 2017-12-31 DIAGNOSIS — M549 Dorsalgia, unspecified: Secondary | ICD-10-CM | POA: Diagnosis not present

## 2017-12-31 DIAGNOSIS — M170 Bilateral primary osteoarthritis of knee: Secondary | ICD-10-CM

## 2017-12-31 DIAGNOSIS — Z5181 Encounter for therapeutic drug level monitoring: Secondary | ICD-10-CM | POA: Diagnosis not present

## 2017-12-31 DIAGNOSIS — G8929 Other chronic pain: Secondary | ICD-10-CM

## 2017-12-31 MED ORDER — CELECOXIB 100 MG PO CAPS: 100.0000 mg | ORAL_CAPSULE | Freq: Two times a day (BID) | ORAL | 2 refills | Status: DC

## 2017-12-31 MED ORDER — TRAMADOL HCL 50 MG PO TABS: 50.0000 mg | ORAL_TABLET | Freq: Three times a day (TID) | ORAL | 5 refills | Status: DC

## 2017-12-31 NOTE — Progress Notes (Signed)
8/20/201910:18 AM  Mathew HabermannSean Burns January 08, 1971, 47 y.o. male [id number]  Chief Complaint  Patient presents with  . Follow-up    upper back pain, Pain is at a 6 today. Was able to get the chair needed to to work comfortably. Needing another form or handicapped parking     HPI:   Patient is a 47 y.o. male with past medical history significant for chronic pain from various MSK causes who presents today for followup and renewal of FMLA  Has lost weight due to exercise - riding stationary bike Gives him more confidence but has not improved his pain Exercise helping with sleep   Has new chair at work, better but not great Provides better lumbar support but head support is not at proper height so still causing upper back strain He tries to stand up more often as that provides more relief, he does have a standing desk.   He is accepting at this point of accommodations given, not ideal but he is able to work with them   FMLA in place, still has about 4-5 days a week when he needs to come in later, arrives around 1030 Employer in the past has not been able to accommodate starting at later hour Mornings are still hardest part of the day, still wakes up very stiff He acknowledges he needs to get a new bed He is a back sleeper, uses pillows under his knees.   Has stopped taking meloxicam, made him feel like it was too much, made him queasy in he stomach Still takes tramadol TID  Requesting renewal of FMLA and disability placard  Fall Risk  12/31/2017 08/05/2017 02/04/2017 01/10/2016 11/07/2015  Falls in the past year? No No Yes Yes No  Number falls in past yr: - - 2 or more 2 or more -  Injury with Fall? - - Yes No -  Comment - - planters foot  - -     Dep[facility] screen [facility] 2/9 12/31/2017 08/05/2017 02/04/2017  Decreased Interest 0 0 0  Down, Depressed, Hopeless 0 0 0  PHQ - 2 Score 0 0 0    No Known Allergies  Prior to Admission medications   Medication Sig Start Date End Date  Taking? Authorizing Provider  traMADol (ULTRAM) 50 MG tablet TAKE 1 TABLET BY MOUTH EVERY 8 HOURS 12/26/17  Yes Myles LippsSantiago, Adriann Ballweg M, MD    Past Medical History:  Diagnosis Date  . Bilateral chronic knee pain   . Chronic back pain     No past surgical history on file.  Social History   Tobacco Use  . Smoking status: Never Smoker  . Smokeless tobacco: Never Used  Substance Use Topics  . Alcohol use: No    No family history on file.  Review of Systems  Constitutional: Negative for chills and fever.  Respiratory: Negative for cough and shortness of breath.   Cardiovascular: Negative for chest pain, palpitations and leg swelling.  Gastrointestinal: Negative for abdominal pain, nausea and vomiting.  Musculoskeletal: Positive for back pain and joint pain.  Neurological: Positive for tingling. Negative for focal weakness.     OBJECTIVE:  Temperature 98.3 F (36.8 C), temperature source Oral, height 6\' 4"  (1.93 m), weight 216 lb 6.4 oz (98.2 kg), SpO2 98 %.130/83 Body mass index is 26.34 kg/m.   Physical Exam  Constitutional: He is oriented to person, place, and time. He appears well-developed and well-nourished.  HENT:  Head: Normocephalic and atraumatic.  Mouth/Throat: Oropharynx is clear and moist.  Eyes: Pupils are equal, round, and reactive to light. EOM are normal.  Neck: Neck supple.  Pulmonary/Chest: Effort normal.  Neurological: He is alert and oriented to person, place, and time.  Skin: Skin is warm and dry.  Psychiatric: He has a normal mood and affect.  Nursing note and vitals reviewed.    ASSESSMENT and PLAN  1. Chronic midline low back pain with right-sided sciatica 2. Upper back pain 3. Primary osteoarthritis of both knees 4. Encounter for therapeutic drug monitoring - ToxASSURE Select 13 (MW), Urine Stable. Continue with work accommodations and exercise routine. FMLA papers will be renewed once received. Encouraged patient to discuss work hours with HR  as seems that by permanently shifting start and end hours he would be able to work 8 hours and meet productivity demands. discussed new bed. Completed paperwork for disability placard.  Continue with  Tramadol. Discussed trial of celebrex given other nsaids, meloxicam, ibu, etc cause GI side effects  UDS done: 12/2017 [place] CSR reviewed: 12/2017 CSA signed: need to do at next visit  Other orders - celecoxib (CELEBREX) 100 MG capsule; Take 1 capsule (100 mg total) by mouth 2 (two) times daily. - traMADol (ULTRAM) 50 MG tablet; Take 1 tablet (50 mg total) by mouth every 8 (eight) hours.  Return in about 6 months (around 07/03/2018).    [name], MD [facility] at [facility] [address] [place], [place] [address] Ph.  [phone] Fax [phone]

## 2017-12-31 NOTE — Patient Instructions (Signed)
° ° ° °  If you have lab work done today you will be contacted with your lab results within the next 2 weeks.  If you have not heard from us then please contact us. The fastest way to get your results is to register for My Chart. ° ° °IF you received an x-ray today, you will receive an invoice from [facility]. Please contact [facility] at [phone] with questions or concerns regarding your invoice.  ° °IF you received labwork today, you will receive an invoice from [facility]. Please contact [facility] at [phone] with questions or concerns regarding your invoice.  ° °Our billing staff will not be able to assist you with questions regarding bills from these companies. ° °You will be contacted with the lab results as soon as they are available. The fastest way to get your results is to activate your My Chart account. Instructions are located on the last page of this paperwork. If you have not heard from us regarding the results in 2 weeks, please contact this office. °  ° ° ° °

## 2018-01-03 LAB — TOXASSURE SELECT 13 (MW), URINE

## 2018-07-02 ENCOUNTER — Ambulatory Visit (INDEPENDENT_AMBULATORY_CARE_PROVIDER_SITE_OTHER): Payer: BLUE CROSS/BLUE SHIELD | Admitting: Family Medicine

## 2018-07-02 ENCOUNTER — Other Ambulatory Visit: Payer: Self-pay

## 2018-07-02 ENCOUNTER — Encounter: Payer: Self-pay | Admitting: Family Medicine

## 2018-07-02 VITALS — BP 110/72 | HR 67 | Temp 98.5°F | Ht 76.0 in | Wt 218.0 lb

## 2018-07-02 DIAGNOSIS — Z791 Long term (current) use of non-steroidal anti-inflammatories (NSAID): Secondary | ICD-10-CM

## 2018-07-02 DIAGNOSIS — M549 Dorsalgia, unspecified: Secondary | ICD-10-CM

## 2018-07-02 DIAGNOSIS — M170 Bilateral primary osteoarthritis of knee: Secondary | ICD-10-CM

## 2018-07-02 DIAGNOSIS — M5441 Lumbago with sciatica, right side: Secondary | ICD-10-CM

## 2018-07-02 DIAGNOSIS — Z5181 Encounter for therapeutic drug level monitoring: Secondary | ICD-10-CM

## 2018-07-02 DIAGNOSIS — G8929 Other chronic pain: Secondary | ICD-10-CM

## 2018-07-02 MED ORDER — CELECOXIB 100 MG PO CAPS: 100.0000 mg | ORAL_CAPSULE | Freq: Two times a day (BID) | ORAL | 5 refills | Status: AC

## 2018-07-02 MED ORDER — TRAMADOL HCL 50 MG PO TABS: 50.0000 mg | ORAL_TABLET | Freq: Three times a day (TID) | ORAL | 5 refills | Status: DC

## 2018-07-02 NOTE — Progress Notes (Signed)
2/19/20208:25 AM  Mathew Burns 07-Feb-1971, 48 y.o. male [id number]  Chief Complaint  Patient presents with  . Back Pain    due to the chair used at work. Work 8 hour. Upper and lower back pain for 3 years    HPI:   Patient is a 48 y.o. male with past medical history significant for chronic pain from various MSK causes who presents today for followup and requesting letter for chair and renewal of FMLA  Last OV Aug 2019 pmp reviewed  "I am hanging in there" He continues to have issues with getting accommodations at work He is supposed to have a chair that supports his lower and upper back and head rest in proper place He gets getting "old chairs". They keep breaking. Once he fell Patient at this time wants to be able to but his own chair made for his height and get reimbursed, he has been looking at models He is in the process of changing insurance Otherwise his medications are working well His lower back, sciatica and knee pain are much better He continues to exercise and stretch for these Needs refill of medications - takes tramadol and celebrex daily as prescribed  Continues to use a raised chair Still parking closer to entrance He continues to be stiff and increase pain in the mornings He still has days when he needs to come in late due to needing to take extra time to heat, stretch, etc   He believes once chair gets resolved things will really get much better  FMLA will be due for renewal about the end of this month, he will drop off forms  Left handed  Wt Readings from Last 3 Encounters:  07/02/18 218 lb (98.9 kg)  12/31/17 216 lb 6.4 oz (98.2 kg)  08/05/17 230 lb (104.3 kg)    Fall Risk  07/02/2018 12/31/2017 08/05/2017 02/04/2017 01/10/2016  Falls in the past year? 0 No No Yes Yes  Number falls in past yr: 0 - - 2 or more 2 or more  Injury with Fall? 0 - - Yes No  Comment - - - [facility]  -     Depression screen [facility] 2/9 07/02/2018 12/31/2017 08/05/2017    Decreased Interest 0 0 0  Down, Depressed, Hopeless 0 0 0  PHQ - 2 Score 0 0 0    No Known Allergies  Prior to Admission medications   Medication Sig Start Date End Date Taking? Authorizing Provider  celecoxib (CELEBREX) 100 MG capsule Take 1 capsule (100 mg total) by mouth 2 (two) times daily. 12/31/17  Yes Myles Lipps, MD  traMADol (ULTRAM) 50 MG tablet Take 1 tablet (50 mg total) by mouth every 8 (eight) hours. 12/31/17  Yes Myles Lipps, MD    Past Medical History:  Diagnosis Date  . Bilateral chronic knee pain   . Chronic back pain     History reviewed. No pertinent surgical history.  Social History   Tobacco Use  . Smoking status: Never Smoker  . Smokeless tobacco: Never Used  Substance Use Topics  . Alcohol use: No    History reviewed. No pertinent family history.  ROS No numbness, tingling, focal weakness Per hpi  OBJECTIVE:  Blood pressure 110/72, pulse 67, temperature 98.5 F (36.9 C), temperature source Oral, height 6\' 4"  (1.93 m), weight 218 lb (98.9 kg), SpO2 100 %. Body mass index is 26.54 kg/m.   Physical Exam Vitals signs and nursing note reviewed.  Constitutional:  Appearance: He is well-developed.  HENT:     Head: Normocephalic and atraumatic.  Eyes:     Conjunctiva/sclera: Conjunctivae normal.     Pupils: Pupils are equal, round, and reactive to light.  Neck:     Musculoskeletal: Neck supple.  Cardiovascular:     Rate and Rhythm: Normal rate and regular rhythm.     Heart sounds: No murmur. No friction rub. No gallop.   Pulmonary:     Effort: Pulmonary effort is normal.     Breath sounds: Normal breath sounds. No wheezing or rales.  Musculoskeletal:     Thoracic back: He exhibits tenderness (midline) and bony tenderness. He exhibits no spasm.  Skin:    General: Skin is warm and dry.  Neurological:     Mental Status: He is alert and oriented to person, place, and time.      ASSESSMENT and PLAN  1. Upper back  pain Not well controlled, aggravated by not having a height appropriate chair. Letter given to patient. Provided patient with handout for rehab exercises. Consider PT referral.  2. Chronic midline low back pain with right-sided sciatica 3. Primary osteoarthritis of both knees Controlled. Cont with medications, current accommodations, exercise and stretching.   4. Encounter for therapeutic drug monitoring - ToxASSURE Select 13 (MW), Urine; Future  5. Encounter for long-term (current) use of NSAIDs - Basic Metabolic Panel; Future  Patient to come back for lab visit once insurance in place.  Patient to bring FMLA forms when due for renewal.   Other orders - celecoxib (CELEBREX) 100 MG capsule; Take 1 capsule (100 mg total) by mouth 2 (two) times daily. - traMADol (ULTRAM) 50 MG tablet; Take 1 tablet (50 mg total) by mouth every 8 (eight) hours.  Return in about 6 months (around 12/31/2018).    Myles Lipps, MD Primary Care at [facility] [address] [place], [place] [address] Ph.  [phone] Fax [phone]

## 2018-07-02 NOTE — Patient Instructions (Signed)
° ° ° °  If you have lab work done today you will be contacted with your lab results within the next 2 weeks.  If you have not heard from us then please contact us. The fastest way to get your results is to register for My Chart. ° ° °IF you received an x-ray today, you will receive an invoice from [facility]. Please contact [facility] at [phone] with questions or concerns regarding your invoice.  ° °IF you received labwork today, you will receive an invoice from [facility]. Please contact [facility] at [phone] with questions or concerns regarding your invoice.  ° °Our billing staff will not be able to assist you with questions regarding bills from these companies. ° °You will be contacted with the lab results as soon as they are available. The fastest way to get your results is to activate your My Chart account. Instructions are located on the last page of this paperwork. If you have not heard from us regarding the results in 2 weeks, please contact this office. °  ° ° ° °

## 2018-09-12 ENCOUNTER — Telehealth: Payer: Self-pay | Admitting: Family Medicine

## 2018-09-12 NOTE — Telephone Encounter (Signed)
Pt dropped off form for disability placard to be filled out. Placed in the providers box.

## 2018-09-15 ENCOUNTER — Telehealth: Payer: Self-pay

## 2018-09-15 NOTE — Telephone Encounter (Signed)
Called pt, lm to let him know that placard form is located at the front office

## 2019-01-20 ENCOUNTER — Other Ambulatory Visit: Payer: Self-pay | Admitting: Family Medicine

## 2019-01-30 ENCOUNTER — Other Ambulatory Visit: Payer: Self-pay | Admitting: Family Medicine

## 2019-02-03 ENCOUNTER — Other Ambulatory Visit: Payer: Self-pay | Admitting: Family Medicine

## 2019-02-03 NOTE — Telephone Encounter (Signed)
Requested medication (s) are due for refill today: yes  Requested medication (s) are on the active medication list: yes  Last refill:  12/17/2018  Future visit scheduled: no  Notes to clinic:  Refill cannot be delegated    Requested Prescriptions  Pending Prescriptions Disp Refills   traMADol (ULTRAM) 50 MG tablet [Pharmacy Med Name: traMADol HCl 50 MG Oral Tablet] 90 tablet 0    Sig: TAKE 1 TABLET BY MOUTH EVERY 8 HOURS     Not Delegated - Analgesics:  Opioid Agonists Failed - 02/03/2019  9:43 AM      Failed - This refill cannot be delegated      Failed - Urine Drug Screen completed in last 360 days.      Failed - Valid encounter within last 6 months    Recent Outpatient Visits          7 months ago Upper back pain   [facility], Lilia Argue, MD   1 year ago Chronic midline low back pain with right-sided sciatica   [facility], Lilia Argue, MD   1 year ago Upper back pain   [facility], Lilia Argue, MD   1 year ago Primary osteoarthritis of both knees   [facility], Lilia Argue, MD   3 years ago Annual physical exam   [facility], Loura Back, MD

## 2019-02-19 ENCOUNTER — Telehealth: Payer: Self-pay | Admitting: Family Medicine

## 2019-02-19 MED ORDER — TRAMADOL HCL 50 MG PO TABS: 50.0000 mg | ORAL_TABLET | Freq: Three times a day (TID) | ORAL | 2 refills | Status: DC

## 2019-02-19 NOTE — Telephone Encounter (Signed)
Pt is wanting a refill of his tramadol to last him until his appt in December. His insurance does not kick in until December 7th. Please advise

## 2019-02-19 NOTE — Telephone Encounter (Signed)
pmp reviewed  Med refilled 

## 2019-02-19 NOTE — Telephone Encounter (Signed)
Please advise 

## 2019-03-09 ENCOUNTER — Telehealth: Payer: Self-pay | Admitting: Family Medicine

## 2019-03-09 NOTE — Telephone Encounter (Signed)
Pt fropped off [employer]mv parkiing placard to be filled out by Dr,Santiago. Put papperwork in [place] [facility] 10.26.2020 FR

## 2019-03-10 ENCOUNTER — Telehealth: Payer: Self-pay

## 2019-03-10 NOTE — Telephone Encounter (Signed)
Called pt to let him know dmv placard form is ready for pick up

## 2019-04-20 ENCOUNTER — Ambulatory Visit: Payer: BLUE CROSS/BLUE SHIELD | Admitting: Family Medicine

## 2019-04-21 ENCOUNTER — Encounter: Payer: Self-pay | Admitting: Family Medicine

## 2019-08-24 ENCOUNTER — Telehealth: Payer: Self-pay

## 2019-08-24 ENCOUNTER — Ambulatory Visit
Admission: RE | Admit: 2019-08-24 | Discharge: 2019-08-24 | Discharge status: Absent without leave | Payer: Managed Care, Other (non HMO) | Source: Ambulatory Visit

## 2019-08-24 ENCOUNTER — Other Ambulatory Visit: Payer: Self-pay | Admitting: Family Medicine

## 2019-08-24 NOTE — Telephone Encounter (Signed)
Requested medication (s) are due for refill today: Yes  Requested medication (s) are on the active medication list: Yes  Last refill:  02/19/19  Future visit scheduled: No  Notes to clinic:  See request.    Requested Prescriptions  Pending Prescriptions Disp Refills   traMADol (ULTRAM) 50 MG tablet [Pharmacy Med Name: traMADol HCl 50 MG Oral Tablet] 90 tablet 0    Sig: TAKE 1 TABLET BY MOUTH EVERY 8 HOURS      Not Delegated - Analgesics:  Opioid Agonists Failed - 08/24/2019  2:39 PM      Failed - This refill cannot be delegated      Failed - Urine Drug Screen completed in last 360 days.      Failed - Valid encounter within last 6 months    Recent Outpatient Visits           1 year ago Upper back pain   [facility], Meda Coffee, MD   1 year ago Chronic midline low back pain with right-sided sciatica   [facility], Meda Coffee, MD   2 years ago Upper back pain   [facility], Meda Coffee, MD   2 years ago Primary osteoarthritis of both knees   [facility], Meda Coffee, MD   3 years ago Annual physical exam   [facility], Maylon Peppers, MD

## 2019-08-24 NOTE — Telephone Encounter (Signed)
Patient is requesting a refill of the following medications: Requested Prescriptions    No prescriptions requested or ordered in this encounter  Tramadol 50 mg   Date of patient request: 08/24/2019 Last office visit: 07/02/2018 Date of last refill: 02/19/2019 Last refill amount: 90 tablets

## 2019-08-24 NOTE — Telephone Encounter (Signed)
Sent message to provider

## 2019-08-24 NOTE — ED Provider Notes (Signed)
Patient was attempting to have a video visit with his PCP.  He did not mean to schedule with UC.     Mickie Bail, NP 08/24/19 [phone]

## 2019-08-25 NOTE — Telephone Encounter (Signed)
pmp reviewd, appropriate meds refilled 

## 2019-09-01 ENCOUNTER — Encounter: Payer: Self-pay | Admitting: Family Medicine

## 2019-09-01 ENCOUNTER — Other Ambulatory Visit: Payer: Self-pay

## 2019-09-01 ENCOUNTER — Telehealth (INDEPENDENT_AMBULATORY_CARE_PROVIDER_SITE_OTHER): Payer: BLUE CROSS/BLUE SHIELD | Admitting: Family Medicine

## 2019-09-01 DIAGNOSIS — M170 Bilateral primary osteoarthritis of knee: Secondary | ICD-10-CM

## 2019-09-01 NOTE — Progress Notes (Signed)
   Virtual Visit Note  I connected with patient on 09/01/19 at 545pm by video via epic and verified that I am speaking with the correct person using two identifiers. Mathew Burns is currently located at home and patient is currently with them during visit. The provider, Myles Lipps, MD is located in their office at time of visit.  I discussed the limitations, risks, security and privacy concerns of performing an evaluation and management service by telephone and the availability of in person appointments. I also discussed with the patient that there may be a patient responsible charge related to this service. The patient expressed understanding and agreed to proceed.   I provided 30 minutes of non-face-to-face time during this encounter.  Chief Complaint  Patient presents with  . knee swelling L knee    caused by R knee bending and painful   . Back Pain    started from L knee swelling     HPI ? Patient with known OA of knees He has overall been doing well with supportive measures and celebrex and tramadol prn He exercises regularly to help maintain function and support He also has adjusted work space in re to ergonomics He has been eating really healthy during pandemic and has lost weight H/o knee injections in past He however for past several weeks has been noticing that high right knee is "bending" medially and this is causing a change in gait which is now stressing his left knee and low back He denies any recent injuries or overlying skin change He does have intermittent swelling He denies any knee instability   No Known Allergies  Prior to Admission medications   Medication Sig Start Date End Date Taking? Authorizing Provider  celecoxib (CELEBREX) 100 MG capsule Take 1 capsule (100 mg total) by mouth 2 (two) times daily. 07/02/18  Yes Myles Lipps, MD  traMADol (ULTRAM) 50 MG tablet TAKE 1 TABLET BY MOUTH EVERY 8 HOURS 08/25/19  Yes Myles Lipps, MD    Past  Medical History:  Diagnosis Date  . Bilateral chronic knee pain   . Chronic back pain     No past surgical history on file.  Social History   Tobacco Use  . Smoking status: Never Smoker  . Smokeless tobacco: Never Used  Substance Use Topics  . Alcohol use: No    No family history on file.  ROS Per hpi  Objective  Vitals as reported by the patient: none  GEN: AAOx3, NAD HEENT: [place]/AT, pupils are symmetrical, EOMI, non-icteric sclera Resp: breathing comfortably, speaking in full sentences Skin: no rashes noted, no pallor Psych: good eye contact, normal mood and affect   ASSESSMENT and PLAN  1. Primary osteoarthritis of both knees Discussed unloading knee brace, possible PT, referral to ortho - AMB referral to orthopedics  FOLLOW-UP: prn   The above assessment and management plan was discussed with the patient. The patient verbalized understanding of and has agreed to the management plan. Patient is aware to call the clinic if symptoms persist or worsen. Patient is aware when to return to the clinic for a follow-up visit. Patient educated on when it is appropriate to go to the emergency department.     Myles Lipps, MD Primary Care at [facility] [address] [place], [place] [address] Ph.  [phone] Fax [phone]

## 2019-09-01 NOTE — Patient Instructions (Signed)
° ° ° °  If you have lab work done today you will be contacted with your lab results within the next 2 weeks.  If you have not heard from us then please contact us. The fastest way to get your results is to register for My Chart. ° ° °IF you received an x-ray today, you will receive an invoice from [facility]. Please contact [facility] at [phone] with questions or concerns regarding your invoice.  ° °IF you received labwork today, you will receive an invoice from [facility]. Please contact [facility] at [phone] with questions or concerns regarding your invoice.  ° °Our billing staff will not be able to assist you with questions regarding bills from these companies. ° °You will be contacted with the lab results as soon as they are available. The fastest way to get your results is to activate your My Chart account. Instructions are located on the last page of this paperwork. If you have not heard from us regarding the results in 2 weeks, please contact this office. °  ° ° ° °

## 2019-09-03 ENCOUNTER — Encounter: Payer: Self-pay | Admitting: Family Medicine

## 2019-10-19 ENCOUNTER — Telehealth: Payer: Self-pay | Admitting: Family Medicine

## 2019-10-19 NOTE — Telephone Encounter (Signed)
Copied from CRM [id number]. Topic: Quick Communication - Rx Refill/Question >> Oct 19, 2019  8:47 AM Jaquita Rector A wrote: Medication: traMADol (ULTRAM) 50 MG tablet   Has the patient contacted their pharmacy? Yes.   (Agent: If no, request that the patient contact the pharmacy for the refill.) (Agent: If yes, when and what did the pharmacy advise?)  Preferred Pharmacy (with phone number or street name): [facility] [address] [place], [place] - [address] [place].  Phone:  [phone] Fax:  [phone]     Agent: Please be advised that RX refills may take up to 3 business days. We ask that you follow-up with your pharmacy.

## 2019-10-19 NOTE — Telephone Encounter (Signed)
Patient is requesting a refill of the following medications: Requested Prescriptions    No prescriptions requested or ordered in this encounter  Tramadol 50 mg  Date of patient request: 10/19/2019 Last office visit: 07/03/2019 Date of last refill: 08/25/2019 Last refill amount: 90 tab Follow up time period per chart: 6 mo

## 2019-10-20 MED ORDER — TRAMADOL HCL 50 MG PO TABS: 50.0000 mg | ORAL_TABLET | Freq: Three times a day (TID) | ORAL | 0 refills | Status: DC

## 2019-10-20 NOTE — Telephone Encounter (Signed)
pmp reviewed Medication refilled

## 2019-11-18 ENCOUNTER — Other Ambulatory Visit: Payer: Self-pay | Admitting: Family Medicine

## 2019-11-18 NOTE — Telephone Encounter (Signed)
Patient requesting refill for Tramadol 50 mg for osteoarthritis. Video visit on 09/01/2019, no future appointment scheduled. Thank you.

## 2019-11-18 NOTE — Telephone Encounter (Signed)
Requested medication (s) are due for refill today -yes  Requested medication (s) are on the active medication list -yes  Future visit scheduled -no  Last refill: 10/20/19  Notes to clinic: Request for non delegated Rx  Requested Prescriptions  Pending Prescriptions Disp Refills   traMADol (ULTRAM) 50 MG tablet [Pharmacy Med Name: TRAMADOL 50 MG TAB[D]] 90 tablet 0    Sig: TAKE ONE TABLET BY MOUTH EVERY 8 HOURS      Not Delegated - Analgesics:  Opioid Agonists Failed - 11/18/2019  9:04 AM      Failed - This refill cannot be delegated      Failed - Urine Drug Screen completed in last 360 days.      Passed - Valid encounter within last 6 months    Recent Outpatient Visits           2 months ago Primary osteoarthritis of both knees   [facility], Meda Coffee, MD   1 year ago Upper back pain   [facility], Meda Coffee, MD   1 year ago Chronic midline low back pain with right-sided sciatica   [facility], Meda Coffee, MD   2 years ago Upper back pain   [facility], Meda Coffee, MD   2 years ago Primary osteoarthritis of both knees   [facility], Meda Coffee, MD                  Requested Prescriptions  Pending Prescriptions Disp Refills   traMADol (ULTRAM) 50 MG tablet [Pharmacy Med Name: TRAMADOL 50 MG TAB[D]] 90 tablet 0    Sig: TAKE ONE TABLET BY MOUTH EVERY 8 HOURS      Not Delegated - Analgesics:  Opioid Agonists Failed - 11/18/2019  9:04 AM      Failed - This refill cannot be delegated      Failed - Urine Drug Screen completed in last 360 days.      Passed - Valid encounter within last 6 months    Recent Outpatient Visits           2 months ago Primary osteoarthritis of both knees   [facility], Meda Coffee, MD   1 year ago Upper back pain   [facility], Meda Coffee, MD   1 year ago Chronic midline low back pain with right-sided sciatica   [facility], Meda Coffee, MD   2 years ago Upper back pain   [facility], Meda Coffee, MD   2 years ago Primary osteoarthritis of both knees   [facility], Meda Coffee, MD

## 2019-11-19 NOTE — Telephone Encounter (Signed)
pmp reviewd, appropriate meds refilled 

## 2019-12-21 ENCOUNTER — Other Ambulatory Visit: Payer: Self-pay | Admitting: Family Medicine

## 2019-12-21 NOTE — Telephone Encounter (Signed)
Requested medication (s) are due for refill today: yes  Requested medication (s) are on the active medication list: yes  Last refill:  11/19/2019  Future visit scheduled: no  Notes to clinic: this refill cannot be delegated    Requested Prescriptions  Pending Prescriptions Disp Refills   traMADol (ULTRAM) 50 MG tablet [Pharmacy Med Name: TRAMADOL 50 MG TAB[D]] 90 tablet 0    Sig: TAKE ONE TABLET BY MOUTH EVERY 8 HOURS      Not Delegated - Analgesics:  Opioid Agonists Failed - 12/21/2019  9:53 AM      Failed - This refill cannot be delegated      Failed - Urine Drug Screen completed in last 360 days.      Passed - Valid encounter within last 6 months    Recent Outpatient Visits           3 months ago Primary osteoarthritis of both knees   [facility], Meda Coffee, MD   1 year ago Upper back pain   [facility], Meda Coffee, MD   1 year ago Chronic midline low back pain with right-sided sciatica   [facility], Meda Coffee, MD   2 years ago Upper back pain   [facility], Meda Coffee, MD   2 years ago Primary osteoarthritis of both knees   [facility], Meda Coffee, MD

## 2019-12-23 ENCOUNTER — Other Ambulatory Visit: Payer: Self-pay | Admitting: Family Medicine

## 2019-12-29 ENCOUNTER — Telehealth: Payer: Self-pay | Admitting: Family Medicine

## 2019-12-29 NOTE — Telephone Encounter (Signed)
Pt is stating that although this script has just been written, he actually changed from [facility] to [facility] due to large diff in pricing. Now Neill Loft is requesting office write him a paper script for their files before they refill this. He cannot get the script until he brings them the actual script. Please FU with pt when this is ready so he will not go too long witout this med. FU at new # updated [phone]

## 2019-12-29 NOTE — Telephone Encounter (Signed)
traMADol (ULTRAM) 50 MG tablet Medication Date: 12/21/2019 Department: [facility] at [facility] Ordering/Authorizing: Myles Lipps, MD  Order Providers  Prescribing Provider Encounter Provider  Myles Lipps, MD Myles Lipps, MD  Outpatient Medication Detail   Disp Refills Start End   traMADol (ULTRAM) 50 MG tablet 90 tablet 0 12/21/2019    Sig: TAKE ONE TABLET BY MOUTH EVERY 8 HOURS   Class: Print    [facility] [address] [place], [place] - [address] [place]. Phone:  [phone]  Fax:  [phone]     Pt is stating that although this script has just been written, he actually changed from [facility] to [facility] due to large diff in pricing. Now Neill Loft is requesting office write him a paper script for their files before they refill this. He cannot get the script until he brings them the actual script. Please FU with pt when this is ready so he will not go too long witout this med. FU at new # updated [phone]

## 2019-12-31 ENCOUNTER — Telehealth: Payer: Self-pay

## 2019-12-31 MED ORDER — TRAMADOL HCL 50 MG PO TABS: 50.0000 mg | ORAL_TABLET | Freq: Three times a day (TID) | ORAL | 5 refills | Status: AC

## 2019-12-31 NOTE — Telephone Encounter (Signed)
Pt called to pick up rx for tramadol/ Rx has been put in the pick up box in front office

## 2019-12-31 NOTE — Telephone Encounter (Signed)
pmp reviewed Med refilled, printed
